# Patient Record
Sex: Male | Born: 1990 | Race: Black or African American | Hispanic: No | Marital: Single | Smoking: Current every day smoker
Health system: Southern US, Community
[De-identification: ages and names within clinical notes are randomized; demographics above are authoritative.]

## PROBLEM LIST (undated history)

## (undated) DIAGNOSIS — J45909 Unspecified asthma, uncomplicated: Secondary | ICD-10-CM

## (undated) DIAGNOSIS — E876 Hypokalemia: Secondary | ICD-10-CM

## (undated) DIAGNOSIS — I10 Essential (primary) hypertension: Secondary | ICD-10-CM

## (undated) HISTORY — DX: Morbid (severe) obesity due to excess calories: E66.01

## (undated) HISTORY — DX: Hypokalemia: E87.6

---
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
## 2017-05-18 ENCOUNTER — Encounter (HOSPITAL_COMMUNITY): Payer: Self-pay | Admitting: Emergency Medicine

## 2017-05-18 ENCOUNTER — Emergency Department (HOSPITAL_COMMUNITY)
Admission: EM | Admit: 2017-05-18 | Discharge: 2017-05-19 | Disposition: A | Discharge status: Home / Self Care | Payer: Self-pay | Attending: Emergency Medicine | Admitting: Emergency Medicine

## 2017-05-18 DIAGNOSIS — R002 Palpitations: Secondary | ICD-10-CM | POA: Insufficient documentation

## 2017-05-18 DIAGNOSIS — F17200 Nicotine dependence, unspecified, uncomplicated: Secondary | ICD-10-CM | POA: Insufficient documentation

## 2017-05-18 DIAGNOSIS — I10 Essential (primary) hypertension: Secondary | ICD-10-CM | POA: Insufficient documentation

## 2017-05-18 DIAGNOSIS — E876 Hypokalemia: Secondary | ICD-10-CM | POA: Insufficient documentation

## 2017-05-18 HISTORY — DX: Essential (primary) hypertension: I10

## 2017-05-18 NOTE — ED Triage Notes (Signed)
Pt reports intermittent dizziness x 2 weeks. Pt reports after returning home from church this evening he began having palpations that he has had in the past, pt he felt worse this time with "fluttering" pt reports he felt very anxious and wants to be checked. Pt states dizziness and palpations have resolved.

## 2017-05-18 NOTE — ED Provider Notes (Signed)
[facility]-EMERGENCY DEPT Provider Note   CSN: [id number] Arrival date & time: 05/18/17  2209  By signing my name below, I, Kenneth Willis, attest that this documentation has been prepared under the direction and in the presence of physician practitioner, Azalia Bilis, MD. Electronically Signed: Linna Willis, Scribe. 05/18/2017. 12:10 AM.  History   Chief Complaint Chief Complaint  Patient presents with  . Palpitations   The history is provided by the patient. No language interpreter was used.    HPI Comments: Kenneth Willis is a 26 y.o. male with PMHx of HTN who presents to the Emergency Department complaining of intermittent palpitations beginning yesterday evening. He states he was at church when his palpitations initially presented. Patient reports his palpitations resolved but presented again a couple of hours later while he was lying down at home. He reports some associated anxiety. He states he has experienced palpitations before but they have never been as significant as his current onset. He feels as though his palpitations are improving and his heart is beating normally. He denies excessive caffeine intake. No recent OTC medications. Patient denies chest pain, dyspnea, nausea, vomiting, lightheadedness, or any other associated symptoms.  Past Medical History:  Diagnosis Date  . Hypertension     There are no active problems to display for this patient.   History reviewed. No pertinent surgical history.     Home Medications    Prior to Admission medications   Not on File    Family History No family history on file.  Social History Social History  Substance Use Topics  . Smoking status: Current Every Day Smoker  . Smokeless tobacco: Never Used  . Alcohol use No     Allergies   Patient has no known allergies.   Review of Systems Review of Systems All other systems reviewed and are negative for acute change except as noted in the HPI. Physical Exam Updated  Vital Signs BP (!) 151/80 (BP Location: Right Arm)   Pulse 83   Temp 98.8 F (37.1 C) (Oral)   Resp 17   Ht 5\' 4"  (1.626 m)   SpO2 99%   Physical Exam  Constitutional: He is oriented to person, place, and time. He appears well-developed and well-nourished.  HENT:  Head: Normocephalic and atraumatic.  Eyes: EOM are normal.  Neck: Normal range of motion.  Cardiovascular: Normal rate, regular rhythm, normal heart sounds and intact distal pulses.   Pulmonary/Chest: Effort normal and breath sounds normal. No respiratory distress.  Abdominal: Soft. He exhibits no distension. There is no tenderness.  Musculoskeletal: Normal range of motion.  Neurological: He is alert and oriented to person, place, and time.  Skin: Skin is warm and dry.  Psychiatric: He has a normal mood and affect. Judgment normal.  Nursing note and vitals reviewed.  ED Treatments / Results  Labs (all labs ordered are listed, but only abnormal results are displayed) Labs Reviewed  I-STAT CHEM 8, ED - Abnormal; Notable for the following:       Result Value   Potassium 2.9 (*)    Chloride 99 (*)    Creatinine, Ser 1.40 (*)    Glucose, Bld 108 (*)    All other components within normal limits    EKG  EKG Interpretation  Date/Time:  Sunday May 18 2017 22:25:38 EDT Ventricular Rate:  77 PR Interval:  216 QRS Duration: 98 QT Interval:  384 QTC Calculation: 434 R Axis:   54 Text Interpretation:  Sinus rhythm with 1st degree A-V  block ST & T wave abnormality, consider inferior ischemia Abnormal ECG No old tracing to compare Confirmed by Azalia Bilisampos, Bilan Tedesco ([id number]) on 05/18/2017 11:15:24 PM       Radiology No results found.  Procedures Procedures (including critical care time)  DIAGNOSTIC STUDIES: Oxygen Saturation is 99% on RA, normal by my interpretation.    COORDINATION OF CARE: 12:08 AM Discussed treatment plan with pt at bedside and pt agreed to plan.  Medications Ordered in ED Medications  potassium  chloride SA (K-DUR,KLOR-CON) CR tablet 40 mEq (not administered)     Initial Impression / Assessment and Plan / ED Course  I have reviewed the triage vital signs and the nursing notes.  Pertinent labs & imaging results that were available during my care of the patient were reviewed by me and considered in my medical decision making (see chart for details).     Overall well-appearing.  Palpitations.  Found to have mild hypokalemia.  This will be replaced orally.  Discharged home with potassium supplementation.  Primary care follow-up.  Final Clinical Impressions(s) / ED Diagnoses   Final diagnoses:  None    New Prescriptions New Prescriptions   POTASSIUM CHLORIDE SA (K-DUR,KLOR-CON) 20 MEQ TABLET    Take 1 tablet (20 mEq total) by mouth daily.   I personally performed the services described in this documentation, which was scribed in my presence. The recorded information has been reviewed and is accurate.       Azalia Bilisampos, Raschelle Wisenbaker, MD 05/19/17 [phone]

## 2017-05-19 LAB — I-STAT CHEM 8, ED
BUN: 18 mg/dL (ref 6–20)
Calcium, Ion: 1.19 mmol/L (ref 1.15–1.40)
Chloride: 99 mmol/L — ABNORMAL LOW (ref 101–111)
Creatinine, Ser: 1.4 mg/dL — ABNORMAL HIGH (ref 0.61–1.24)
Glucose, Bld: 108 mg/dL — ABNORMAL HIGH (ref 65–99)
HCT: 45 % (ref 39.0–52.0)
HEMOGLOBIN: 15.3 g/dL (ref 13.0–17.0)
POTASSIUM: 2.9 mmol/L — AB (ref 3.5–5.1)
SODIUM: 139 mmol/L (ref 135–145)
TCO2: 28 mmol/L (ref 0–100)

## 2017-05-19 MED ORDER — POTASSIUM CHLORIDE CRYS ER 20 MEQ PO TBCR: 20.0000 meq | EXTENDED_RELEASE_TABLET | Freq: Every day | ORAL | 0 refills | Status: DC

## 2017-05-19 MED ORDER — POTASSIUM CHLORIDE CRYS ER 20 MEQ PO TBCR
40.0000 meq | EXTENDED_RELEASE_TABLET | Freq: Once | ORAL | Status: AC
  Administered 2017-05-19: 40 meq via ORAL
  Filled 2017-05-19: qty 2

## 2017-05-19 NOTE — ED Notes (Signed)
Pt understood dc material. NAD noted. Scripts given at dc 

## 2017-06-26 ENCOUNTER — Emergency Department (HOSPITAL_COMMUNITY)
Admission: EM | Admit: 2017-06-26 | Discharge: 2017-06-26 | Disposition: A | Discharge status: Home / Self Care | Payer: Self-pay | Attending: Emergency Medicine | Admitting: Emergency Medicine

## 2017-06-26 ENCOUNTER — Encounter (HOSPITAL_COMMUNITY): Payer: Self-pay

## 2017-06-26 ENCOUNTER — Emergency Department (HOSPITAL_COMMUNITY): Payer: Self-pay

## 2017-06-26 DIAGNOSIS — R079 Chest pain, unspecified: Secondary | ICD-10-CM | POA: Insufficient documentation

## 2017-06-26 DIAGNOSIS — F17210 Nicotine dependence, cigarettes, uncomplicated: Secondary | ICD-10-CM | POA: Insufficient documentation

## 2017-06-26 DIAGNOSIS — J45909 Unspecified asthma, uncomplicated: Secondary | ICD-10-CM | POA: Insufficient documentation

## 2017-06-26 DIAGNOSIS — Z79899 Other long term (current) drug therapy: Secondary | ICD-10-CM | POA: Insufficient documentation

## 2017-06-26 DIAGNOSIS — R002 Palpitations: Secondary | ICD-10-CM | POA: Insufficient documentation

## 2017-06-26 DIAGNOSIS — I10 Essential (primary) hypertension: Secondary | ICD-10-CM | POA: Insufficient documentation

## 2017-06-26 HISTORY — DX: Unspecified asthma, uncomplicated: J45.909

## 2017-06-26 LAB — BASIC METABOLIC PANEL
Anion gap: 9 (ref 5–15)
BUN: 11 mg/dL (ref 6–20)
CHLORIDE: 101 mmol/L (ref 101–111)
CO2: 28 mmol/L (ref 22–32)
CREATININE: 1.12 mg/dL (ref 0.61–1.24)
Calcium: 9.4 mg/dL (ref 8.9–10.3)
GFR calc non Af Amer: >60 mL/min (ref >60)
Glucose, Bld: 142 mg/dL — ABNORMAL HIGH (ref 65–99)
Potassium: 3.5 mmol/L (ref 3.5–5.1)
SODIUM: 138 mmol/L (ref 135–145)

## 2017-06-26 LAB — POCT I-STAT TROPONIN I: Troponin i, poc: 0 ng/mL (ref 0.00–0.08)

## 2017-06-26 LAB — CBC
HCT: 49.7 % (ref 39.0–52.0)
Hemoglobin: 17.9 g/dL — ABNORMAL HIGH (ref 13.0–17.0)
MCH: 31.4 pg (ref 26.0–34.0)
MCHC: 36 g/dL (ref 30.0–36.0)
MCV: 87.2 fL (ref 78.0–100.0)
PLATELETS: 176 10*3/uL (ref 150–400)
RBC: 5.7 MIL/uL (ref 4.22–5.81)
RDW: 13.4 % (ref 11.5–15.5)
WBC: 5 10*3/uL (ref 4.0–10.5)

## 2017-06-26 NOTE — ED Notes (Signed)
Bed: WA10 Expected date:  Expected time:  Means of arrival:  Comments: 

## 2017-06-26 NOTE — Discharge Instructions (Signed)
All the results in the ER are normal, labs and imaging. We are not sure what is causing your symptoms. The workup in the ER is not complete, and is limited to screening for life threatening and emergent conditions only, so please see a primary care doctor for further evaluation.  

## 2017-06-26 NOTE — ED Provider Notes (Signed)
[facility]-EMERGENCY DEPT Provider Note   CSN: [id number] Arrival date & time: 06/26/17  0047  By signing my name below, I, Cynda AcresHailei Fulton, attest that this documentation has been prepared under the direction and in the presence of Derwood KaplanNanavati, Aspen Lawrance, MD. Electronically Signed: Cynda AcresHailei Fulton, Scribe. 06/26/17. 3:37 AM.  History   Chief Complaint Chief Complaint  Patient presents with  . Palpitations    HPI Comments: Kenneth Willis is a 26 y.o. male with a history of asthma and hypertension, who presents to the Emergency Department complaining of persistent palpitations that began this morning. Patient reports intermittent chest palpitations with 4/10 chest pian. Patient states his episodes last a few seconds. Patient states his symptoms have improved since the day has progressed, he has no chest pain at present. Patient states every time he goes to the hospital he is usually told that he has low potassium. No additional symptoms noted. Patient reports eating a banana with some relief. Patient denies any drug use, but does report daily tobacco use of half a pack a day. Patient denies any shortness of breath, cough, leg swelling, diaphoresis, fever, chills, nausea, vomiting, diarrhea, or any additional symptoms.   The history is provided by the patient. No language interpreter was used.    Past Medical History:  Diagnosis Date  . Asthma   . Hypertension     There are no active problems to display for this patient.   History reviewed. No pertinent surgical history.     Home Medications    Prior to Admission medications   Medication Sig Start Date End Date Taking? Authorizing Provider  albuterol (PROVENTIL HFA;VENTOLIN HFA) 108 (90 Base) MCG/ACT inhaler Inhale 1-2 puffs into the lungs every 6 (six) hours as needed for wheezing or shortness of breath.   Yes [provider]  hydrochlorothiazide (HYDRODIURIL) 25 MG tablet Take 50 mg by mouth daily.   Yes [provider]    metoprolol tartrate (LOPRESSOR) 50 MG tablet Take 50 mg by mouth 2 (two) times daily.   Yes [provider]  potassium chloride SA (K-DUR,KLOR-CON) 20 MEQ tablet Take 1 tablet (20 mEq total) by mouth daily. Patient not taking: Reported on 06/26/2017 05/19/17   Azalia Bilisampos, Kevin, MD    Family History History reviewed. No pertinent family history.  Social History Social History  Substance Use Topics  . Smoking status: Current Every Day Smoker  . Smokeless tobacco: Never Used  . Alcohol use No     Allergies   Patient has no known allergies.   Review of Systems Review of Systems  Constitutional: Negative for chills and fever.  Respiratory: Negative for cough and shortness of breath.   Cardiovascular: Positive for chest pain and palpitations. Negative for leg swelling.  Gastrointestinal: Negative for diarrhea, nausea and vomiting.  All other systems reviewed and are negative.    Physical Exam Updated Vital Signs BP 123/82 (BP Location: Left Arm)   Pulse 74   Temp 98.5 F (36.9 C) (Oral)   Resp 18   SpO2 99%   Physical Exam  Constitutional: He is oriented to person, place, and time. He appears well-developed.  HENT:  Head: Normocephalic and atraumatic.  Mouth/Throat: Oropharynx is clear and moist.  Eyes: Pupils are equal, round, and reactive to light. Conjunctivae and EOM are normal.  Neck: Normal range of motion. Neck supple.  Cardiovascular: Normal rate and regular rhythm.   1+ and equal radial pulse bilaterally.   Pulmonary/Chest: Effort normal and breath sounds normal.  No reproducible chest  wall tenderness.   Abdominal: Soft. Bowel sounds are normal.  Positive bowel sounds.   Neurological: He is alert and oriented to person, place, and time.  Skin: Skin is warm and dry.  Nursing note and vitals reviewed.    ED Treatments / Results  DIAGNOSTIC STUDIES: Oxygen Saturation is 99% on RA, normal by my interpretation.    COORDINATION OF CARE: 3:37 AM  Discussed treatment plan with pt at bedside and pt agreed to plan.  Labs (all labs ordered are listed, but only abnormal results are displayed) Labs Reviewed  BASIC METABOLIC PANEL - Abnormal; Notable for the following:       Result Value   Glucose, Bld 142 (*)    All other components within normal limits  CBC - Abnormal; Notable for the following:    Hemoglobin 17.9 (*)    All other components within normal limits  I-STAT TROPOININ, ED  POCT I-STAT TROPONIN I    EKG  EKG Interpretation  Date/Time:  Thursday June 26 2017 01:05:34 EDT Ventricular Rate:  76 PR Interval:    QRS Duration: 92 QT Interval:  365 QTC Calculation: 411 R Axis:   62 Text Interpretation:  Sinus rhythm Borderline prolonged PR interval Borderline repolarization abnormality No acute changes No significant change since last tracing Confirmed by Derwood Kaplan ([id number]) on 06/26/2017 2:44:25 AM       Radiology Dg Chest 2 View  Result Date: 06/26/2017 CLINICAL DATA:  26 year old male with sudden onset chest pain. EXAM: CHEST  2 VIEW COMPARISON:  None. FINDINGS: The heart size and mediastinal contours are within normal limits. Both lungs are clear. The visualized skeletal structures are unremarkable. IMPRESSION: No active cardiopulmonary disease. Electronically Signed   By: Elgie Collard M.D.   On: 06/26/2017 03:06    Procedures Procedures (including critical care time)  Medications Ordered in ED Medications - No data to display   Initial Impression / Assessment and Plan / ED Course  I have reviewed the triage vital signs and the nursing notes.  Pertinent labs & imaging results that were available during my care of the patient were reviewed by me and considered in my medical decision making (see chart for details).     Pt comes in with cc of palpitations. Pt has intermittent palpitations, no heart racing. There is no associated n/v/f/c. Pt has no associated chest pain, dib.  Labs normal, ekg is  reassuring. No alarms for tachycardia here. Stable for d/c.   Final Clinical Impressions(s) / ED Diagnoses   Final diagnoses:  Palpitations    New Prescriptions New Prescriptions   No medications on file   I personally performed the services described in this documentation, which was scribed in my presence. The recorded information has been reviewed and is accurate.     Derwood Kaplan, MD 06/26/17 [id number]

## 2017-06-26 NOTE — ED Triage Notes (Signed)
Pt reports intermittent left sided palpations/cp 4/10 that started this 7/11 in the AM. Pt A+OX4, speaking in complete sentences, ambulatory independently to triage.

## 2017-06-28 ENCOUNTER — Emergency Department (HOSPITAL_COMMUNITY): Payer: Self-pay

## 2017-06-28 ENCOUNTER — Emergency Department (HOSPITAL_COMMUNITY)
Admission: EM | Admit: 2017-06-28 | Discharge: 2017-06-28 | Disposition: A | Discharge status: Home / Self Care | Payer: Self-pay | Attending: Emergency Medicine | Admitting: Emergency Medicine

## 2017-06-28 ENCOUNTER — Encounter (HOSPITAL_COMMUNITY): Payer: Self-pay | Admitting: Emergency Medicine

## 2017-06-28 ENCOUNTER — Other Ambulatory Visit: Payer: Self-pay

## 2017-06-28 DIAGNOSIS — Z5321 Procedure and treatment not carried out due to patient leaving prior to being seen by health care provider: Secondary | ICD-10-CM | POA: Insufficient documentation

## 2017-06-28 LAB — CBC
HCT: 50.6 % (ref 39.0–52.0)
Hemoglobin: 17.8 g/dL — ABNORMAL HIGH (ref 13.0–17.0)
MCH: 31.1 pg (ref 26.0–34.0)
MCHC: 35.2 g/dL (ref 30.0–36.0)
MCV: 88.3 fL (ref 78.0–100.0)
PLATELETS: 188 10*3/uL (ref 150–400)
RBC: 5.73 MIL/uL (ref 4.22–5.81)
RDW: 13.3 % (ref 11.5–15.5)
WBC: 4.4 10*3/uL (ref 4.0–10.5)

## 2017-06-28 LAB — BASIC METABOLIC PANEL
Anion gap: 10 (ref 5–15)
BUN: 9 mg/dL (ref 6–20)
CO2: 27 mmol/L (ref 22–32)
CREATININE: 1.18 mg/dL (ref 0.61–1.24)
Calcium: 9.4 mg/dL (ref 8.9–10.3)
Chloride: 101 mmol/L (ref 101–111)
GFR calc Af Amer: >60 mL/min (ref >60)
Glucose, Bld: 99 mg/dL (ref 65–99)
Potassium: 3.4 mmol/L — ABNORMAL LOW (ref 3.5–5.1)
SODIUM: 138 mmol/L (ref 135–145)

## 2017-06-28 LAB — I-STAT TROPONIN, ED: Troponin i, poc: 0.01 ng/mL (ref 0.00–0.08)

## 2017-06-28 NOTE — ED Notes (Signed)
Pt called to bring to room pt did not answer x3.

## 2017-06-28 NOTE — ED Triage Notes (Signed)
C/o intermittent palpitations and L sided chest pain all day on Friday. Denies chest pain at present but states L arm is hurting.  Denies sob, nausea, and vomiting.

## 2017-06-28 NOTE — ED Notes (Signed)
No answer x3

## 2017-08-06 ENCOUNTER — Emergency Department (HOSPITAL_COMMUNITY): Payer: Self-pay

## 2017-08-06 ENCOUNTER — Emergency Department (HOSPITAL_COMMUNITY)
Admission: EM | Admit: 2017-08-06 | Discharge: 2017-08-06 | Disposition: A | Discharge status: Home / Self Care | Payer: Self-pay | Attending: Emergency Medicine | Admitting: Emergency Medicine

## 2017-08-06 ENCOUNTER — Encounter (HOSPITAL_COMMUNITY): Payer: Self-pay

## 2017-08-06 DIAGNOSIS — R002 Palpitations: Secondary | ICD-10-CM | POA: Insufficient documentation

## 2017-08-06 DIAGNOSIS — E876 Hypokalemia: Secondary | ICD-10-CM | POA: Insufficient documentation

## 2017-08-06 DIAGNOSIS — Z79899 Other long term (current) drug therapy: Secondary | ICD-10-CM | POA: Insufficient documentation

## 2017-08-06 DIAGNOSIS — I10 Essential (primary) hypertension: Secondary | ICD-10-CM | POA: Insufficient documentation

## 2017-08-06 DIAGNOSIS — J45909 Unspecified asthma, uncomplicated: Secondary | ICD-10-CM | POA: Insufficient documentation

## 2017-08-06 DIAGNOSIS — R7989 Other specified abnormal findings of blood chemistry: Secondary | ICD-10-CM

## 2017-08-06 DIAGNOSIS — F17210 Nicotine dependence, cigarettes, uncomplicated: Secondary | ICD-10-CM | POA: Insufficient documentation

## 2017-08-06 LAB — BASIC METABOLIC PANEL
ANION GAP: 6 (ref 5–15)
BUN: 14 mg/dL (ref 6–20)
CALCIUM: 9.4 mg/dL (ref 8.9–10.3)
CHLORIDE: 103 mmol/L (ref 101–111)
CO2: 29 mmol/L (ref 22–32)
CREATININE: 1.27 mg/dL — AB (ref 0.61–1.24)
GLUCOSE: 101 mg/dL — AB (ref 65–99)
Potassium: 3 mmol/L — ABNORMAL LOW (ref 3.5–5.1)
SODIUM: 138 mmol/L (ref 135–145)

## 2017-08-06 LAB — CBC
HCT: 46.9 % (ref 39.0–52.0)
Hemoglobin: 16.8 g/dL (ref 13.0–17.0)
MCH: 31.3 pg (ref 26.0–34.0)
MCHC: 35.8 g/dL (ref 30.0–36.0)
MCV: 87.3 fL (ref 78.0–100.0)
Platelets: 164 10*3/uL (ref 150–400)
RBC: 5.37 MIL/uL (ref 4.22–5.81)
RDW: 13.5 % (ref 11.5–15.5)
WBC: 4.5 10*3/uL (ref 4.0–10.5)

## 2017-08-06 LAB — POCT I-STAT TROPONIN I: TROPONIN I, POC: 0.01 ng/mL (ref 0.00–0.08)

## 2017-08-06 MED ORDER — POTASSIUM CHLORIDE CRYS ER 20 MEQ PO TBCR
40.0000 meq | EXTENDED_RELEASE_TABLET | Freq: Once | ORAL | Status: AC
  Administered 2017-08-06: 40 meq via ORAL
  Filled 2017-08-06: qty 2

## 2017-08-06 MED ORDER — POTASSIUM CHLORIDE CRYS ER 20 MEQ PO TBCR: 20.0000 meq | EXTENDED_RELEASE_TABLET | Freq: Every day | ORAL | 0 refills | Status: DC

## 2017-08-06 NOTE — ED Provider Notes (Signed)
[facility]-EMERGENCY DEPT Provider Note   CSN: [id number] Arrival date & time: 08/06/17  1825     History   Chief Complaint Chief Complaint  Patient presents with  . Palpitations    HPI Kenneth Willis is a 26 y.o. male.  Patient presents to the ED with a chief complaint of heart palpitations. He states that he has had palpitations in the past before. He states that when he has had them before, it has been because of low potassium.  States that he has been seen here for the same.  He denies any lightheadedness, dizziness, CP, SOB, or abdominal pain.  He denies any nausea, vomiting, or diarrhea.  Denies any stimulant use.  He doesn't have a PCP.  He is fairly new to the area.   The history is provided by the patient. No language interpreter was used.    Past Medical History:  Diagnosis Date  . Asthma   . Hypertension     There are no active problems to display for this patient.   History reviewed. No pertinent surgical history.     Home Medications    Prior to Admission medications   Medication Sig Start Date End Date Taking? Authorizing Provider  albuterol (PROVENTIL HFA;VENTOLIN HFA) 108 (90 Base) MCG/ACT inhaler Inhale 1-2 puffs into the lungs every 6 (six) hours as needed for wheezing or shortness of breath.    [provider]  hydrochlorothiazide (HYDRODIURIL) 25 MG tablet Take 50 mg by mouth daily.    [provider]  metoprolol tartrate (LOPRESSOR) 50 MG tablet Take 50 mg by mouth 2 (two) times daily.    [provider]  potassium chloride SA (K-DUR,KLOR-CON) 20 MEQ tablet Take 1 tablet (20 mEq total) by mouth daily. Patient not taking: Reported on 06/26/2017 05/19/17   Azalia Bilis, MD    Family History No family history on file.  Social History Social History  Substance Use Topics  . Smoking status: Current Every Day Smoker    Packs/day: 0.35    Types: Cigarettes  . Smokeless tobacco: Never Used  . Alcohol use No     Allergies     Fish allergy and Shellfish allergy   Review of Systems Review of Systems  All other systems reviewed and are negative.    Physical Exam Updated Vital Signs BP (!) 149/89 (BP Location: Right Arm)   Pulse 79   Temp 98.2 F (36.8 C) (Oral)   Resp 14   Ht 5\' 6"  (1.676 m)   Wt (!) 154.2 kg (340 lb)   SpO2 99%   BMI 54.88 kg/m   Physical Exam  Constitutional: He is oriented to person, place, and time. He appears well-developed and well-nourished.  HENT:  Head: Normocephalic and atraumatic.  Eyes: Pupils are equal, round, and reactive to light. Conjunctivae and EOM are normal. Right eye exhibits no discharge. Left eye exhibits no discharge. No scleral icterus.  Neck: Normal range of motion. Neck supple. No JVD present.  Cardiovascular: Normal rate, regular rhythm and normal heart sounds.  Exam reveals no gallop and no friction rub.   No murmur heard. Pulmonary/Chest: Effort normal and breath sounds normal. No respiratory distress. He has no wheezes. He has no rales. He exhibits no tenderness.  Abdominal: Soft. He exhibits no distension and no mass. There is no tenderness. There is no rebound and no guarding.  Musculoskeletal: Normal range of motion. He exhibits no edema or tenderness.  Neurological: He is alert and oriented to person, place, and  time.  Skin: Skin is warm and dry.  Psychiatric: He has a normal mood and affect. His behavior is normal. Judgment and thought content normal.  Nursing note and vitals reviewed.    ED Treatments / Results  Labs (all labs ordered are listed, but only abnormal results are displayed) Labs Reviewed  BASIC METABOLIC PANEL - Abnormal; Notable for the following:       Result Value   Potassium 3.0 (*)    Glucose, Bld 101 (*)    Creatinine, Ser 1.27 (*)    All other components within normal limits  CBC  I-STAT TROPONIN, ED  POCT I-STAT TROPONIN I    EKG  EKG Interpretation None       Radiology Dg Chest 2 View  Result Date:  08/06/2017 CLINICAL DATA:  Chest palpitations at work yesterday through this morning. EXAM: CHEST  2 VIEW COMPARISON:  06/28/2017 CXR FINDINGS: The heart size and mediastinal contours are within normal limits. Both lungs are clear. The visualized skeletal structures are unremarkable. IMPRESSION: No active cardiopulmonary disease. Electronically Signed   By: Tollie Eth M.D.   On: 08/06/2017 20:04    Procedures Procedures (including critical care time)  Medications Ordered in ED Medications - No data to display   Initial Impression / Assessment and Plan / ED Course  I have reviewed the triage vital signs and the nursing notes.  Pertinent labs & imaging results that were available during my care of the patient were reviewed by me and considered in my medical decision making (see chart for details).  Clinical Course as of Aug 06 2256  Wed Aug 06, 2017  2042 EKG 12-Lead [CK]  2054 DG Chest 2 View [CK]  2056 EKG 12-Lead [CK]    Clinical Course User Index [CK] Jacqlyn Krauss, [place]    Patient with palpitations.  Hx of the same.  Mild hypokalemia; thought to be 2/2 HCTZ.  Will replete potassium in ED and discharge home with potassium supplement for the next few days.  Recommend PCP follow-up, will give resources to establish care.    Patient agrees with plan.  He has no complaints now.  Cr also mildly elevated, encouraged to drink more water.    Final Clinical Impressions(s) / ED Diagnoses   Final diagnoses:  Palpitations  Hypokalemia  Elevated serum creatinine    New Prescriptions Current Discharge Medication List       Felipa Furnace 08/06/17 2306    Zadie Rhine, MD 08/06/17 2317

## 2017-08-06 NOTE — Discharge Instructions (Signed)
Please follow-up with the group listed below.  Make an appointment ASAP.

## 2017-08-06 NOTE — ED Triage Notes (Signed)
Patient c/o having palpitations at work yesterday most of the day and through this morning. Patient is not currently having palpitations. Patient denies any SOB or chest pain at this time.

## 2017-10-24 ENCOUNTER — Encounter (HOSPITAL_COMMUNITY): Payer: Self-pay | Admitting: Emergency Medicine

## 2017-10-24 ENCOUNTER — Emergency Department (HOSPITAL_COMMUNITY)
Admission: EM | Admit: 2017-10-24 | Discharge: 2017-10-25 | Disposition: A | Discharge status: Home / Self Care | Payer: Self-pay | Attending: Emergency Medicine | Admitting: Emergency Medicine

## 2017-10-24 ENCOUNTER — Other Ambulatory Visit: Payer: Self-pay

## 2017-10-24 DIAGNOSIS — I10 Essential (primary) hypertension: Secondary | ICD-10-CM | POA: Insufficient documentation

## 2017-10-24 DIAGNOSIS — Z760 Encounter for issue of repeat prescription: Secondary | ICD-10-CM | POA: Insufficient documentation

## 2017-10-24 DIAGNOSIS — Z91013 Allergy to seafood: Secondary | ICD-10-CM | POA: Insufficient documentation

## 2017-10-24 DIAGNOSIS — R002 Palpitations: Secondary | ICD-10-CM | POA: Insufficient documentation

## 2017-10-24 DIAGNOSIS — F17210 Nicotine dependence, cigarettes, uncomplicated: Secondary | ICD-10-CM | POA: Insufficient documentation

## 2017-10-24 DIAGNOSIS — E876 Hypokalemia: Secondary | ICD-10-CM | POA: Insufficient documentation

## 2017-10-24 DIAGNOSIS — J45909 Unspecified asthma, uncomplicated: Secondary | ICD-10-CM | POA: Insufficient documentation

## 2017-10-24 LAB — I-STAT CHEM 8, ED
BUN: 10 mg/dL (ref 6–20)
Calcium, Ion: 1.16 mmol/L (ref 1.15–1.40)
Chloride: 99 mmol/L — ABNORMAL LOW (ref 101–111)
Creatinine, Ser: 1.3 mg/dL — ABNORMAL HIGH (ref 0.61–1.24)
GLUCOSE: 95 mg/dL (ref 65–99)
HEMATOCRIT: 56 % — AB (ref 39.0–52.0)
HEMOGLOBIN: 19 g/dL — AB (ref 13.0–17.0)
POTASSIUM: 3.1 mmol/L — AB (ref 3.5–5.1)
Sodium: 139 mmol/L (ref 135–145)
TCO2: 26 mmol/L (ref 22–32)

## 2017-10-24 MED ORDER — POTASSIUM CHLORIDE CRYS ER 20 MEQ PO TBCR
40.0000 meq | EXTENDED_RELEASE_TABLET | Freq: Once | ORAL | Status: AC
  Administered 2017-10-24: 40 meq via ORAL
  Filled 2017-10-24: qty 2

## 2017-10-24 MED ORDER — ALBUTEROL SULFATE HFA 108 (90 BASE) MCG/ACT IN AERS
1.0000 | INHALATION_SPRAY | Freq: Once | RESPIRATORY_TRACT | Status: AC
  Administered 2017-10-24: 1 via RESPIRATORY_TRACT
  Filled 2017-10-24: qty 6.7

## 2017-10-24 MED ORDER — POTASSIUM CHLORIDE CRYS ER 20 MEQ PO TBCR: 20.0000 meq | EXTENDED_RELEASE_TABLET | Freq: Every day | ORAL | 0 refills | Status: DC

## 2017-10-24 NOTE — ED Notes (Signed)
Notified EDP,Mcmaness,MD., pt. I-stat Chem 8 results potassium 3.1 and RN,Lisa made aware.

## 2017-10-24 NOTE — ED Triage Notes (Signed)
Pt states he has been having heart palpitations today  Pt states he takes HCTZ and Metoprolol and ususally when he starts getting these palpitations it is due to low potassium   Pt states he has been having some left arm weakness and some sharp pains in his left upper chest  Denies pain at this time

## 2017-10-24 NOTE — ED Provider Notes (Signed)
[facility]-EMERGENCY DEPT Provider Note   CSN: [id number] Arrival date & time: 10/24/17  2022     History   Chief Complaint Chief Complaint  Patient presents with  . Palpitations    HPI Kenneth Willis is a 26 y.o. male with history of asthma and hypertension who presents today with chief complaint acute onset, resolved episode of palpitations which occurred earlier today.  Patient states that this morning he experienced an episode of palpitations which lasted a few seconds before resolving and has not experienced the symptoms since.  States that in the past he has had palpitations related to hypokalemia.  He did take his HCTZ today.  He saw a cardiologist/vascular specialist last year who had been prescribing his medications, but he has been receiving refills of his metoprolol, HCTZ, and albuterol from the ED most recently.  He does not have a primary care physician or cardiologist here.  He is still smoking approximately half a pack of cigarettes daily.  Denies excessive caffeine intake, drug use.  He denies lightheadedness, chest pain, shortness of breath, leg swelling, abdominal pain, or vomiting.  He had nausea earlier today but this resolved.  No aggravating or alleviating factors noted.  He did not try anything for his symptoms.  He denies recent travel or surgeries, hemoptysis, testosterone hormone replacement, prior history of DVT or PE, IV drug use, or history of cancer.  He is requesting albuterol refill.  The history is provided by the patient.    Past Medical History:  Diagnosis Date  . Asthma   . Hypertension     There are no active problems to display for this patient.   History reviewed. No pertinent surgical history.     Home Medications    Prior to Admission medications   Medication Sig Start Date End Date Taking? Authorizing Provider  albuterol (PROVENTIL HFA;VENTOLIN HFA) 108 (90 Base) MCG/ACT inhaler Inhale 1-2 puffs into the lungs  every 6 (six) hours as needed for wheezing or shortness of breath.    [provider]  hydrochlorothiazide (HYDRODIURIL) 25 MG tablet Take 50 mg by mouth daily.    [provider]  metoprolol tartrate (LOPRESSOR) 50 MG tablet Take 50 mg by mouth 2 (two) times daily.    [provider]  potassium chloride SA (K-DUR,KLOR-CON) 20 MEQ tablet Take 1 tablet (20 mEq total) daily for 3 days by mouth. 10/24/17 10/27/17  Jeanie SewerFawze, Kamilah Correia A, PA-C    Family History Family History  Problem Relation Age of Onset  . Diabetes Other   . Hypertension Other     Social History Social History   Tobacco Use  . Smoking status: Current Every Day Smoker    Packs/day: 0.35    Types: Cigarettes  . Smokeless tobacco: Never Used  Substance Use Topics  . Alcohol use: No  . Drug use: No     Allergies   Fish allergy and Shellfish allergy   Review of Systems Review of Systems  Constitutional: Positive for fatigue. Negative for chills and fever.  Eyes: Negative for visual disturbance.  Respiratory: Negative for cough and shortness of breath.   Cardiovascular: Positive for palpitations. Negative for chest pain and leg swelling.  Gastrointestinal: Positive for nausea (resolved). Negative for abdominal pain and vomiting.  Neurological: Negative for weakness, numbness and headaches.     Physical Exam Updated Vital Signs BP 137/77   Pulse 64   Temp 98.8 F (37.1 C) (Oral)   Resp 18   SpO2 100%  Physical Exam  Constitutional: He is oriented to person, place, and time. He appears well-developed and well-nourished. No distress.  HENT:  Head: Normocephalic and atraumatic.  Eyes: Conjunctivae are normal. Right eye exhibits no discharge. Left eye exhibits no discharge.  Neck: Normal range of motion. Neck supple. No JVD present. No tracheal deviation present. No thyromegaly present.  Cardiovascular: Normal rate, regular rhythm, normal heart sounds and intact distal pulses.  2+  radial and DP/PT pulses bl, negative Homan's bl   Pulmonary/Chest: Effort normal and breath sounds normal. No stridor. No respiratory distress. He has no wheezes. He has no rales. He exhibits no tenderness.  Abdominal: Soft. Bowel sounds are normal. He exhibits no distension. There is no tenderness.  Musculoskeletal: Normal range of motion. He exhibits no edema or tenderness.  Neurological: He is alert and oriented to person, place, and time. No sensory deficit. He exhibits normal muscle tone.  Skin: Skin is warm and dry. No erythema.  Psychiatric: He has a normal mood and affect. His behavior is normal.  Nursing note and vitals reviewed.    ED Treatments / Results  Labs (all labs ordered are listed, but only abnormal results are displayed) Labs Reviewed  I-STAT CHEM 8, ED - Abnormal; Notable for the following components:      Result Value   Potassium 3.1 (*)    Chloride 99 (*)    Creatinine, Ser 1.30 (*)    Hemoglobin 19.0 (*)    HCT 56.0 (*)    All other components within normal limits    EKG  EKG Interpretation  Date/Time:  Friday October 24 2017 20:37:19 EST Ventricular Rate:  73 PR Interval:    QRS Duration: 93 QT Interval:  354 QTC Calculation: 390 R Axis:   61 Text Interpretation:  Sinus rhythm Normal ECG T-wave changes no longer present Confirmed by Paula Libra ([id number]) on 10/24/2017 10:50:33 PM       Radiology No results found.  Procedures Procedures (including critical care time)  Medications Ordered in ED Medications  potassium chloride SA (K-DUR,KLOR-CON) CR tablet 40 mEq (40 mEq Oral Given 10/24/17 2339)  albuterol (PROVENTIL HFA;VENTOLIN HFA) 108 (90 Base) MCG/ACT inhaler 1 puff (1 puff Inhalation Given 10/24/17 2341)     Initial Impression / Assessment and Plan / ED Course  I have reviewed the triage vital signs and the nursing notes.  Pertinent labs & imaging results that were available during my care of the patient were reviewed by me and  considered in my medical decision making (see chart for details).     Patient with one brief episode of palpitations earlier today, resolved and has not returned.  Afebrile, vital signs are stable.  States this is typically due to hypokalemia while taking HCTZ.  No focal neurological deficits on examination.  He is PERC negative and I have low suspicion of PE.  EKG shows normal sinus rhythm, no evidence of arrhythmia or ST segment abnormality.  Low suspicion of ACS or MI in the absence of chest pain.   he is mildly hypokalemic, will replenish here in the ED today and sent home with potassium supplement to take home for the next few days.  Low suspicion of thyroid abnormality, although this is a possibility.  Emphasized the importance of follow-up with a primary care physician or cardiologist for reevaluation of his symptoms, possible Holter monitoring, and medication management.  Discussed indications for return to the ED.  Will refill his albuterol inhaler here today. Pt verbalized understanding of  and agreement with plan and is safe for discharge home at this time.   Final Clinical Impressions(s) / ED Diagnoses   Final diagnoses:  Palpitations  Hypokalemia    ED Discharge Orders        Ordered    potassium chloride SA (K-DUR,KLOR-CON) 20 MEQ tablet  Daily     10/24/17 2354       Jeanie SewerFawze, Martese Vanatta A, PA-C 10/25/17 0055    Rolland PorterJames, Mark, MD 11/02/17 2221

## 2017-10-24 NOTE — Discharge Instructions (Signed)
Take potassium once daily for 3 days as prescribed beginning tomorrow.  He received some potassium in the emergency department today.  Continue taking your medications as prescribed.  Follow-up with a primary care physician or cardiologist for reevaluation of your symptoms.  They may want to test your thyroid or do a Holter monitor.  Return to the ED for any concerning signs or symptoms develop.

## 2017-10-29 ENCOUNTER — Ambulatory Visit (INDEPENDENT_AMBULATORY_CARE_PROVIDER_SITE_OTHER): Payer: Self-pay | Admitting: Cardiology

## 2017-10-29 ENCOUNTER — Encounter: Payer: Self-pay | Admitting: Cardiology

## 2017-10-29 VITALS — BP 142/101 | HR 75 | Ht 66.0 in | Wt 305.0 lb

## 2017-10-29 DIAGNOSIS — Z13220 Encounter for screening for lipoid disorders: Secondary | ICD-10-CM

## 2017-10-29 DIAGNOSIS — Z716 Tobacco abuse counseling: Secondary | ICD-10-CM

## 2017-10-29 DIAGNOSIS — E876 Hypokalemia: Secondary | ICD-10-CM

## 2017-10-29 DIAGNOSIS — I10 Essential (primary) hypertension: Secondary | ICD-10-CM

## 2017-10-29 MED ORDER — LOSARTAN POTASSIUM 25 MG PO TABS: 25.0000 mg | ORAL_TABLET | Freq: Every day | ORAL | 6 refills | Status: DC

## 2017-10-29 NOTE — Progress Notes (Signed)
PCP: Patient, [name]  Clinic Note: Chief Complaint  Patient presents with  . New Patient (Initial Visit)    Hypertension, palpitations, hypokalemia  . Hospitalization Follow-up    HPI: Kenneth Willis is a 26 y.o. male with a PMH below who presents today for emergency room follow-up for palpitations, hypokalemia and hypertension. He has had hypertension diagnosed back when he was 26 years old. He has been on HCTZ, but has recently been having issues with hypokalemia.  He really only notes palpitations and chest discomfort when his potassium levels are low.  Kenneth Willis has been seen on several occasions in the emergency room over the last couple months for palpitations and chest discomfort.  Numerous emergency room visits dating back from February 2018 all the way to most recently October 12.  He has been evaluated in several different hospitals including [facility], [facility], [facility] as well as [facility]. He was actually seen by [facility] Gilmore [facility]Akhtar at [facility]ology in [place].  He had an echocardiogram done.  He reported to me that he had a stress echo done, but I cannot find the results.  -->  [place] and is therefore following up here.  Recent Hospitalizations:   October 05, 2017 [facility]) --noted 1 day history of intermittent lancing chest discomfort migratory throughout the chest.  Also noted couple weeks of palpitations usually associated with hypokalemia. -->  Potassium 2.9 (was 2.7 on October 7)  October 27, 2017 [facility]) -again palpitations happening when he tried to sleep.  Also feeling in the anterior portion of his chest.  Again noted to be hypokalemic (2.8).  Given potassium.  Was yet again referred to cardiology follow-up.  Studies Personally Reviewed - (if available, images/films reviewed: From Epic Chart or Care Everywhere)  Echo Jan 2018 - [facility] Cards Kenneth Willis(Kenneth Willis): Normal LV size and function.  EF 60-65%.  Mild to moderate  LVH.  GR 1 DD.  Trace TR.  Normal valves otherwise  Interval History: Kenneth Willis actually is a very pleasant young gentleman who is morbidly obese with a long-standing history of hypertension who is here now to establish a new cardiologist having moved from [place] area.  He has a distant family history of some cardiac disease with a maternal grandmother having heart failure maternal grandfather having an MI at the age of 26. He really has not had any further symptoms since his last time in the ER.  His potassium was repleted and he is now on standing Potassium supplementation.  He only notes that when his blood pressure is a little bit he gets a little bit of a headache and dizzy, but is feeling okay today.  He denies any more palpitations.  He indicates that he really only has the palpitations and his potassium is low.  He has not had any further chest pain or tightness.  He usually notes the tightness in his chest when it is associated with his asthma. He denies any PND, orthopnea or edema.  No syncope/near syncope or TIA/amaurosis fugax.  No claudication.  ROS: A comprehensive was performed. Review of Systems  Constitutional: Negative for malaise/fatigue and weight loss (Had been trying to lose weight, but not doing so well).  HENT: Negative for congestion, sinus pain and sore throat.   Respiratory: Positive for shortness of breath and wheezing.        With asthma  Gastrointestinal: Negative for abdominal pain, heartburn and nausea.  Genitourinary: Negative  for dysuria and frequency.  Musculoskeletal: Negative for back pain, falls and joint pain.       He does have cramps in his potassium level goes low  Skin: Negative.   Neurological: Positive for headaches (If his blood pressure gets high).  Endo/Heme/Allergies: Negative for environmental allergies.  Psychiatric/Behavioral: Negative for memory loss. The patient is not nervous/anxious and does not have insomnia.   All other  systems reviewed and are negative.  I have reviewed and (if needed) personally updated the patient's problem list, medications, allergies, past medical and surgical history, social and family history.   Past Medical History:  Diagnosis Date  . Asthma   . Hypertension 2011  . Hypokalemia due to loss of potassium    -While on HCTZ  . Morbid obesity (HCC)     History reviewed. No pertinent surgical history.  Current Meds  Medication Sig  . aspirin EC 81 MG tablet Take 81 mg daily as needed by mouth.  . metoprolol tartrate (LOPRESSOR) 50 MG tablet Take 50 mg by mouth 2 (two) times daily.  . [DISCONTINUED] hydrochlorothiazide (HYDRODIURIL) 25 MG tablet Take 50 mg by mouth daily.    Allergies  Allergen Reactions  . Fish Allergy Swelling  . Shrimp [Shellfish Allergy] Swelling    Social History   Socioeconomic History  . Marital status: Single    Spouse name: None  . Number of children: None  . Years of education: None  . Highest education level: None  Social Needs  . Financial resource strain: None  . Food insecurity - worry: None  . Food insecurity - inability: None  . Transportation needs - medical: None  . Transportation needs - non-medical: None  Occupational History  . None  Tobacco Use  . Smoking status: Current Every Day Smoker    Packs/day: 0.35    Types: Cigarettes  . Smokeless tobacco: Never Used  Substance and Sexual Activity  . Alcohol use: No  . Drug use: No  . Sexual activity: Not Currently  Other Topics Concern  . None  Social History Narrative   Recently moved from [place] to [place]    family history includes Arthritis in his mother; Asthma in his brother and sister; Diabetes in his other and sister; Heart attack (age of onset: 8) in his maternal grandfather; Heart failure in his maternal grandmother; Hypertension in his father, mother, other, and sister.    Wt Readings from Last 3 Encounters:  10/29/17 (!) 305 lb (138.3 kg)  08/06/17 (!)  340 lb (154.2 kg)    PHYSICAL EXAM BP (!) 142/101   Pulse 75   Ht 5\' 6"  (1.676 m)   Wt (!) 305 lb (138.3 kg)   BMI 49.23 kg/m  Physical Exam  Constitutional: He is oriented to person, place, and time. He appears well-developed and well-nourished. No distress.  Morbidly obese.  Well-groomed  HENT:  Head: Normocephalic and atraumatic.  Nose: Nose normal.  Mouth/Throat: No oropharyngeal exudate.  Eyes: Conjunctivae and EOM are normal. Pupils are equal, round, and reactive to light. No scleral icterus.  Neck: Normal range of motion. Neck supple. No hepatojugular reflux and no JVD present. Carotid bruit is not present. No tracheal deviation present. No thyromegaly present.  Cardiovascular: Exam reveals distant heart sounds. Exam reveals no gallop and no friction rub.  No murmur heard. Pulmonary/Chest: Effort normal and breath sounds normal. No respiratory distress. He has no wheezes. He has no rales.  Mild interstitial sounds  Abdominal: Soft. Bowel sounds are normal.  He exhibits no distension. There is no tenderness. There is no rebound.  Truncal obesity  Musculoskeletal: Normal range of motion. He exhibits edema (Trivial). He exhibits no deformity.  Lymphadenopathy:    He has no cervical adenopathy.  Neurological: He is alert and oriented to person, place, and time. No cranial nerve deficit. Coordination normal.  Skin: Skin is warm and dry. No rash noted. No erythema.  Psychiatric: He has a normal mood and affect. His behavior is normal. Judgment and thought content normal.    Adult ECG Report No EKG  Other studies Reviewed: Additional studies/ records that were reviewed today include:  Recent Labs: Labs checked today to confirm improvement of potassium.  Most recent potassium was 2.8 on November 12. Lab Results  Component Value Date   CREATININE 1.10 10/30/2017   BUN 10 10/30/2017   NA 143 10/30/2017   K 3.6 10/30/2017   CL 103 10/30/2017   CO2 26 10/30/2017   Chronically  elevated CK levels (early November).-Ranging from 220-304. -Negative troponin levels. November 12: CBC -WBC 6.1, H/H 17/47.8, platelet 166.   ASSESSMENT / PLAN: Problem List Items Addressed This Visit    Essential hypertension - Primary (Chronic)    Not as well-controlled today as one would anticipate.  Unfortunately had to stop the HCTZ.  I will list this as an allergy. With stopping HCTZ, will start losartan 25 mg daily.  He will follow-up with labs and a blood pressure check at [facility] ([facility] -pharmacist run hypertension, lipid and anticoagulation clinic). I suspect that he will probably need to have losartan further titrated. 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  Potassium levels in 2.7 range with palpitations it is very concerning. -Recheck labs immediately upon completion of his visit today.  Thankfully his potassium is up to 3.6.  I asked that he continue taking potassium until we can recheck in 2 weeks.  Plan: I will discontinue HCTZ, start losartan 25 mg daily.      Relevant Medications   potassium chloride SA (K-DUR,KLOR-CON) 20 MEQ tablet   Other Relevant Orders   Basic metabolic panel (Completed)   Comprehensive metabolic panel   Lipid panel   Morbid obesity (HCC)    The patient understands the need to lose weight with diet and exercise. We have discussed specific strategies for this.      Relevant Medications   aspirin EC 81 MG tablet   Other Relevant Orders   Comprehensive metabolic panel   Lipid panel   Screening for  hyperlipidemia    Obese young man with hypertension.   For additional risk stratification, will check lipid panel and chemistries.      Relevant Orders   Comprehensive metabolic panel   Lipid panel   Tobacco abuse counseling    I briefly discussed the importance of smoking cessation with him.  In the course of this initial visit, I did not have time to go into details.  Will discuss further and follow-up visits.  Current medicines are reviewed at length with the patient today. (+/- concerns) concerned about low potassium The following changes have been made:DC HCTZ  Patient Instructions  MEDICATION CHANGES  -- STOP TAKING HCTZ ( FLUID PILL)  --- START TAKING  LOSARTAN ONE TABLET DAILY     LABS-- [facility] OR [facility] TOMORROW---BMP  IN 2 WEEKS CMP LIPID  - DO NOT EAT OR DRINK THE MORNING OF THE TEST    Your physician recommends that you schedule a follow-up appointment in 2 WEEKS WITH [facility] - BLOOD PRESSURE -- PLEASE HAVE LAB WORK THAT DAY.     Your physician recommends that you schedule a follow-up appointment in 3 MONTHS WITH DR HARDING.    Studies Ordered:   Orders Placed This Encounter  Procedures  . Basic metabolic panel  . Comprehensive metabolic panel  . Lipid panel      Bryan Lemmaavid Harding, M.D., M.S. Interventional Cardiologist   Pager # [phone] Phone # [phone] [address] [place], [place] [address]

## 2017-10-29 NOTE — Patient Instructions (Signed)
MEDICATION CHANGES  -- STOP TAKING HCTZ ( FLUID PILL)  --- START TAKING  LOSARTAN ONE TABLET DAILY     LABS-- [facility] TOMORROW---BMP  IN 2 WEEKS CMP LIPID  - DO NOT EAT OR DRINK THE MORNING OF THE TEST    Your physician recommends that you schedule a follow-up appointment in 2 WEEKS WITH [facility] - BLOOD PRESSURE -- PLEASE HAVE LAB WORK THAT DAY.     Your physician recommends that you schedule a follow-up appointment in 3 MONTHS WITH DR HARDING.

## 2017-10-31 LAB — BASIC METABOLIC PANEL
BUN/Creatinine Ratio: 9 (ref 9–20)
BUN: 10 mg/dL (ref 6–20)
CO2: 26 mmol/L (ref 20–29)
CREATININE: 1.1 mg/dL (ref 0.76–1.27)
Calcium: 9.6 mg/dL (ref 8.7–10.2)
Chloride: 103 mmol/L (ref 96–106)
GFR, EST AFRICAN AMERICAN: 107 mL/min/{1.73_m2} (ref >59)
GFR, EST NON AFRICAN AMERICAN: 92 mL/min/{1.73_m2} (ref >59)
Glucose: 89 mg/dL (ref 65–99)
POTASSIUM: 3.6 mmol/L (ref 3.5–5.2)
SODIUM: 143 mmol/L (ref 134–144)

## 2017-11-01 ENCOUNTER — Encounter: Payer: Self-pay | Admitting: Cardiology

## 2017-11-01 DIAGNOSIS — Z716 Tobacco abuse counseling: Secondary | ICD-10-CM | POA: Insufficient documentation

## 2017-11-01 DIAGNOSIS — Z13220 Encounter for screening for lipoid disorders: Secondary | ICD-10-CM | POA: Insufficient documentation

## 2017-11-01 NOTE — Assessment & Plan Note (Signed)
The patient understands the need to lose weight with diet and exercise. We have discussed specific strategies for this.  

## 2017-11-01 NOTE — Assessment & Plan Note (Signed)
Obese young man with hypertension.   For additional risk stratification, will check lipid panel and chemistries.

## 2017-11-01 NOTE — Assessment & Plan Note (Signed)
Not as well-controlled today as one would anticipate.  Unfortunately had to stop the HCTZ.  I will list this as an allergy. With stopping HCTZ, will start losartan 25 mg daily.  He will follow-up with labs and a blood pressure check at [facility] ([facility] -pharmacist run hypertension, lipid and anticoagulation clinic). I suspect that he will probably need to have losartan further titrated. He is otherwise on current stable dose of metoprolol

## 2017-11-01 NOTE — Assessment & Plan Note (Signed)
I briefly discussed the importance of smoking cessation with him.  In the course of this initial visit, I did not have time to go into details.  Will discuss further and follow-up visits.

## 2017-11-01 NOTE — Assessment & Plan Note (Signed)
Amazing to me that he has been on HCTZ with hypokalemia STEMI times and this is never been stopped.  Potassium levels in 2.7 range with palpitations it is very concerning. -Recheck labs immediately upon completion of his visit today.  Thankfully his potassium is up to 3.6.  I asked that he continue taking potassium until we can recheck in 2 weeks.  Plan: I will discontinue HCTZ, start losartan 25 mg daily.

## 2017-11-03 ENCOUNTER — Telehealth: Payer: Self-pay | Admitting: Cardiology

## 2017-11-03 DIAGNOSIS — E876 Hypokalemia: Secondary | ICD-10-CM

## 2017-11-03 MED ORDER — POTASSIUM CHLORIDE ER 20 MEQ PO TBCR: 20.0000 meq | EXTENDED_RELEASE_TABLET | Freq: Every day | ORAL | 3 refills | Status: DC

## 2017-11-03 NOTE — Telephone Encounter (Signed)
Returned the call to the patient. He stated that he was calling for his lab results and to find out if he needs to continue his potassium supplement.   Per Dr. Herbie BaltimoreHarding, he should continue potassium 20 meq daily. He will his labs rechecked on 12/4 at his blood pressure check with pharmd. The potassium has been called in for him per his request.

## 2017-11-03 NOTE — Telephone Encounter (Signed)
Pt would like his lab results from lat week please.

## 2017-11-04 NOTE — Telephone Encounter (Signed)
Yes let us keep taking it for now.

## 2017-11-09 ENCOUNTER — Emergency Department (HOSPITAL_COMMUNITY): Payer: Medicaid Other

## 2017-11-09 ENCOUNTER — Emergency Department (HOSPITAL_COMMUNITY)
Admission: EM | Admit: 2017-11-09 | Discharge: 2017-11-10 | Disposition: A | Discharge status: Home / Self Care | Payer: Medicaid Other | Attending: Emergency Medicine | Admitting: Emergency Medicine

## 2017-11-09 ENCOUNTER — Encounter (HOSPITAL_COMMUNITY): Payer: Self-pay

## 2017-11-09 DIAGNOSIS — R079 Chest pain, unspecified: Secondary | ICD-10-CM

## 2017-11-09 DIAGNOSIS — F17210 Nicotine dependence, cigarettes, uncomplicated: Secondary | ICD-10-CM | POA: Insufficient documentation

## 2017-11-09 DIAGNOSIS — J45909 Unspecified asthma, uncomplicated: Secondary | ICD-10-CM | POA: Insufficient documentation

## 2017-11-09 DIAGNOSIS — R002 Palpitations: Secondary | ICD-10-CM | POA: Insufficient documentation

## 2017-11-09 DIAGNOSIS — Z79899 Other long term (current) drug therapy: Secondary | ICD-10-CM | POA: Insufficient documentation

## 2017-11-09 DIAGNOSIS — I10 Essential (primary) hypertension: Secondary | ICD-10-CM | POA: Insufficient documentation

## 2017-11-09 LAB — I-STAT TROPONIN, ED: TROPONIN I, POC: 0 ng/mL (ref 0.00–0.08)

## 2017-11-09 NOTE — ED Provider Notes (Signed)
[facility]-EMERGENCY DEPT Provider Note   CSN: [id number] Arrival date & time: 11/09/17  2048     History   Chief Complaint Chief Complaint  Patient presents with  . Palpitations    HPI Georgiann CockerHarry Roosevelt is a 26 y.o. male with PMH/o hypertension who presents emergency department for evaluation of palpitations that began at approximately 3 PM this afternoon.  Patient reports that palpitations have been intermittent since then but states that on ED arrival, they have resolved.  Patient reports he had one episode of chest pain at approximately 7pm.  He states that the pain was at one specific point and denies any radiation.  Denies any alleviating or aggravating factors. He states that that has resolved and denies any current chest pain or difficulty breathing.  Patient is followed by cardiology for evaluation of the palpitations.  He has never been on a Holter monitor.  Patient denies any recent caffeine intake.  Patient denies any illicit drug use.  Patient does smoke half a pack of cigarettes a day.  He denies any alcohol use.  Patient denies any recent fever, difficulty breathing, abdominal pain, nausea/vomiting.  Patient denies any personal history of MIs.  He denies any family history of early MIs at young age. He denies any hormone use, recent immobilization, prior history of DVT/PE, recent surgery, leg swelling, or long travel.  Patient does report that he recently had his high blood pressure medication change.  He was discontinued on the HCTZ and started on losartan and metoprolol.  The history is provided by the patient.    Past Medical History:  Diagnosis Date  . Asthma   . Hypertension 2011  . Hypokalemia due to loss of potassium    -While on HCTZ  . Morbid obesity [facility](HCC)     Patient Active Problem List   Diagnosis Date Noted  . Screening for hyperlipidemia 11/01/2017  . Morbid obesity (HCC) 11/01/2017  . Tobacco abuse counseling 11/01/2017  . Essential  hypertension 10/29/2017  . Hypokalemia due to loss of potassium - related ot HCTZ 10/29/2017    History reviewed. No pertinent surgical history.     Home Medications    Prior to Admission medications   Medication Sig Start Date End Date Taking? Authorizing Provider  albuterol (PROVENTIL HFA;VENTOLIN HFA) 108 (90 Base) MCG/ACT inhaler Inhale 1-2 puffs into the lungs every 6 (six) hours as needed for wheezing or shortness of breath.    [provider]  aspirin EC 81 MG tablet Take 81 mg daily as needed by mouth.    [provider]  losartan (COZAAR) 25 MG tablet Take 1 tablet (25 mg total) daily by mouth. 10/29/17 01/27/18  Marykay LexHarding, David W, MD  metoprolol tartrate (LOPRESSOR) 50 MG tablet Take 50 mg by mouth 2 (two) times daily.    [provider]  Potassium Chloride ER 20 MEQ TBCR Take 20 mEq daily by mouth. 11/03/17 02/01/18  Marykay LexHarding, David W, MD    Family History Family History  Problem Relation Age of Onset  . Diabetes Other   . Hypertension Other   . Hypertension Mother   . Arthritis Mother   . Hypertension Father   . Asthma Sister   . Diabetes Sister   . Hypertension Sister   . Asthma Brother   . Heart failure Maternal Grandmother   . Heart attack Maternal Grandfather [id number]    Social History Social History   Tobacco Use  . Smoking status: Current Every Day Smoker  Packs/day: 0.35    Types: Cigarettes  . Smokeless tobacco: Never Used  Substance Use Topics  . Alcohol use: No  . Drug use: No     Allergies   Fish allergy and Shrimp [shellfish allergy]   Review of Systems Review of Systems  Constitutional: Negative for fever.  Respiratory: Negative for cough and shortness of breath.   Cardiovascular: Positive for chest pain (resolved) and palpitations. Negative for leg swelling.  Gastrointestinal: Negative for abdominal pain, nausea and vomiting.  Genitourinary: Negative for dysuria and hematuria.  Neurological: Negative for  dizziness, weakness, numbness and headaches.     Physical Exam Updated Vital Signs BP 137/75 (BP Location: Right Arm)   Pulse 74   Temp 98.6 F (37 C) (Oral)   Resp 20   SpO2 100%   Physical Exam  Constitutional: He is oriented to person, place, and time. He appears well-developed and well-nourished.  Appears anxious but no acute distress  HENT:  Head: Normocephalic and atraumatic.  Mouth/Throat: Oropharynx is clear and moist and mucous membranes are normal.  Eyes: Conjunctivae, EOM and lids are normal. Pupils are equal, round, and reactive to light.  Neck: Full passive range of motion without pain.  Cardiovascular: Normal rate, regular rhythm, normal heart sounds and normal pulses. Exam reveals no gallop and no friction rub.  No murmur heard. Pulses:      Radial pulses are 2+ on the right side, and 2+ on the left side.  Pulmonary/Chest: Effort normal and breath sounds normal.  No evidence of respiratory distress. Able to speak in full sentences without difficulty.  Abdominal: Soft. Normal appearance. There is no tenderness. There is no rigidity and no guarding.  Musculoskeletal: Normal range of motion.  Neurological: He is alert and oriented to person, place, and time.  Skin: Skin is warm and dry. Capillary refill takes less than 2 seconds.  Psychiatric: He has a normal mood and affect. His speech is normal.  Nursing note and vitals reviewed.    ED Treatments / Results  Labs (all labs ordered are listed, but only abnormal results are displayed) Labs Reviewed  BASIC METABOLIC PANEL  CBC WITH DIFFERENTIAL/PLATELET  I-STAT TROPONIN, ED    EKG  EKG Interpretation  Date/Time:  Sunday November 09 2017 20:56:09 EST Ventricular Rate:  76 PR Interval:    QRS Duration: 84 QT Interval:  353 QTC Calculation: 397 R Axis:   65 Text Interpretation:  Sinus rhythm Borderline T wave abnormalities Baseline wander in lead(s) II III aVR aVF No significant change since last tracing  Confirmed by Allen, Anthony ([id number]) on 11/09/2017 9:38:15 PM       Radiology Dg Chest 2 View  Result Date: 11/09/2017 CLINICAL DATA:  Palpitation EXAM: CHEST  2 VIEW COMPARISON:  08/06/2017 FINDINGS: The heart size and mediastinal contours are within normal limits. Both lungs are clear. The visualized skeletal structures are unremarkable. IMPRESSION: No active cardiopulmonary disease. Electronically Signed   By: Kim  Fujinaga M.D.   On: 11/09/2017 22:55    Procedures Procedures (including critical care time)  Medications Ordered in ED Medications - No data to display   Initial Impression / Assessment and Plan / ED Course  I have reviewed the triage vital signs and the nursing notes.  Pertinent labs & imaging results that were available during my care of the patient were reviewed by me and considered in my medical decision making (see chart for details).     26  year old male with past medical history of hypertension and  palpitations who presents with palpitations that began approximately 3 PM this afternoon.  Patient reports that they were intermittent and states by the time he came to the emergency department, they had resolved.  Patient of one episode of chest pain in a probably 7 PM.  Patient states that the chest pain has resolved.  No shortness of breath, nausea/vomiting, diaphoresis.  Patient is followed by cardiology with Dr. Herbie BaltimoreHarding.  His cardiology recently discontinued his HCTZ and started him on losartan and metoprolol. Patient is afebrile, non-toxic appearing, sitting comfortably on examination table. Vital signs reviewed and stable.  Consider anxiety versus palpitations versus electrolyte imbalance.  Also consider ACS etiology, though low suspicion. Patient is PERC negative and is low risk for PE and does not need further testing.  Plan to check EKG, chest x-ray, basic labs.  EKG reviewed.  Sinus rhythm, rate 76 with borderline T wave abnormalities.  Appears to be consistent  with previous EKGs.  Chest x-ray negative for any acute infectious etiology.  Initial troponin is negative.  Patient signed out to [place] Russo, PA-C with labs pending.  Updated patient on plan.  Anticipate discharge after labs and delta troponin.  Encourage patient to follow-up with cardiologist regarding symptoms.  Final Clinical Impressions(s) / ED Diagnoses   Final diagnoses:  Palpitations  Chest pain, unspecified type    ED Discharge Orders    None       Rosana HoesLayden, Victorya Hillman A, PA-C 11/11/17 1627    Lorre NickAllen, Anthony, MD 11/17/17 [phone]

## 2017-11-09 NOTE — Discharge Instructions (Addendum)
As we discussed, you need to follow-up with your cardiologist regarding her symptoms today.  Call and arrange for an appointment.  Continue taking her high blood pressure medications as directed.  Return emergency department for any chest pain, difficulty breathing, palpitations, nausea/vomiting, or any other worsening or concerning symptoms.

## 2017-11-09 NOTE — ED Triage Notes (Signed)
Pt complains of heart palpitations and dizziness since earlier today Pt denies nausea or vomiting Pt has hx of the same with low potassium

## 2017-11-10 ENCOUNTER — Other Ambulatory Visit: Payer: Self-pay

## 2017-11-10 LAB — BASIC METABOLIC PANEL
ANION GAP: 5 (ref 5–15)
BUN: 9 mg/dL (ref 6–20)
CO2: 23 mmol/L (ref 22–32)
CREATININE: 1.1 mg/dL (ref 0.61–1.24)
Calcium: 8.7 mg/dL — ABNORMAL LOW (ref 8.9–10.3)
Chloride: 112 mmol/L — ABNORMAL HIGH (ref 101–111)
GFR calc Af Amer: >60 mL/min (ref >60)
Glucose, Bld: 82 mg/dL (ref 65–99)
Potassium: 4.2 mmol/L (ref 3.5–5.1)
Sodium: 140 mmol/L (ref 135–145)

## 2017-11-10 LAB — CBC WITH DIFFERENTIAL/PLATELET
BASOS ABS: 0 10*3/uL (ref 0.0–0.1)
Basophils Relative: 0 %
EOS ABS: 0.1 10*3/uL (ref 0.0–0.7)
EOS PCT: 1 %
HCT: 45.8 % (ref 39.0–52.0)
HEMOGLOBIN: 16.2 g/dL (ref 13.0–17.0)
LYMPHS ABS: 2.6 10*3/uL (ref 0.7–4.0)
LYMPHS PCT: 55 %
MCH: 31.7 pg (ref 26.0–34.0)
MCHC: 35.4 g/dL (ref 30.0–36.0)
MCV: 89.6 fL (ref 78.0–100.0)
Monocytes Absolute: 0.3 10*3/uL (ref 0.1–1.0)
Monocytes Relative: 6 %
NEUTROS PCT: 38 %
Neutro Abs: 1.8 10*3/uL (ref 1.7–7.7)
PLATELETS: 171 10*3/uL (ref 150–400)
RBC: 5.11 MIL/uL (ref 4.22–5.81)
RDW: 13.9 % (ref 11.5–15.5)
WBC: 4.8 10*3/uL (ref 4.0–10.5)

## 2017-11-10 LAB — I-STAT TROPONIN, ED: TROPONIN I, POC: 0 ng/mL (ref 0.00–0.08)

## 2017-11-10 NOTE — ED Provider Notes (Signed)
Care assumed from [facility] Layden, PA-C, pending CBC, BMP results and repeat troponin.  The patient with past medical history of hypertension, presenting for palpitations and an episode of chest pain.  Troponin and chest x-ray negative.  Vital signs stable.  PERC negative.  Anticipate discharge with cardiology.  3:17 AM CBC and BMP unremarkable.  Delta troponin negative.  Discussed results with patient recommended cardiology follow-up.  Patient is safe for discharge at this time.  Discussed results, findings, treatment and follow up. Patient advised of return precautions. Patient verbalized understanding and agreed with plan.    Zenna Traister, [place] N, PA-C 11/10/17 [phone]    Lorre NickAllen, Anthony, MD 11/12/17 [phone]

## 2017-11-18 ENCOUNTER — Ambulatory Visit (INDEPENDENT_AMBULATORY_CARE_PROVIDER_SITE_OTHER): Payer: Self-pay | Admitting: Pharmacist

## 2017-11-18 ENCOUNTER — Encounter: Payer: Self-pay | Admitting: Pharmacist

## 2017-11-18 VITALS — BP 132/88 | HR 68 | Wt 310.6 lb

## 2017-11-18 DIAGNOSIS — I10 Essential (primary) hypertension: Secondary | ICD-10-CM

## 2017-11-18 LAB — BASIC METABOLIC PANEL
BUN/Creatinine Ratio: 9 (ref 9–20)
BUN: 10 mg/dL (ref 6–20)
CO2: 21 mmol/L (ref 20–29)
CREATININE: 1.07 mg/dL (ref 0.76–1.27)
Calcium: 9.5 mg/dL (ref 8.7–10.2)
Chloride: 104 mmol/L (ref 96–106)
GFR calc Af Amer: 110 mL/min/{1.73_m2} (ref >59)
GFR, EST NON AFRICAN AMERICAN: 95 mL/min/{1.73_m2} (ref >59)
GLUCOSE: 72 mg/dL (ref 65–99)
Potassium: 4.5 mmol/L (ref 3.5–5.2)
Sodium: 141 mmol/L (ref 134–144)

## 2017-11-18 NOTE — Patient Instructions (Addendum)
Return for a  follow up appointment as needed   Your blood pressure today is 132/88 pulse 68  Check your blood pressure at home daily (if able) and keep record of the readings.  Take your BP meds as follows: **ALL medication as prescribed**  Bring all of your meds, your BP cuff and your record of home blood pressures to your next appointment.  Exercise as you're able, try to walk approximately 30 minutes per day.  Keep salt intake to a minimum, especially watch canned and prepared boxed foods.  Eat more fresh fruits and vegetables and fewer canned items.  Avoid eating in fast food restaurants.    HOW TO TAKE YOUR BLOOD PRESSURE: . Rest 5 minutes before taking your blood pressure. .  Don't smoke or drink caffeinated beverages for at least 30 minutes before. . Take your blood pressure before (not after) you eat. . Sit comfortably with your back supported and both feet on the floor (don't cross your legs). . Elevate your arm to heart level on a table or a desk. . Use the proper sized cuff. It should fit smoothly and snugly around your bare upper arm. There should be enough room to slip a fingertip under the cuff. The bottom edge of the cuff should be 1 inch above the crease of the elbow. . Ideally, take 3 measurements at one sitting and record the average.

## 2017-11-18 NOTE — Progress Notes (Signed)
Patient ID: Kenneth CockerHarry Willis                 DOB: [birth year]                      MRN: [id number]     HPI: Kenneth Willis is a 26 y.o. male referred by Dr. Herbie BaltimoreHarding to HTN clinic.  PMH includes palpitations, hypokalemia, hypertension, obesity, and asthma. Noted multiple emergency room visits for palpitations and chest pain. Patient needs to establish with PCP and is very fearful of having MI due to recent death of friend. During most recent office visit with DR Herbie BaltimoreHarding his HCTZ was discontinued due to hypokalemia and replaced by losartan 25mg  daily. He reports less palpitations, some fatigue but no significant health changes. Patient is currently working on decreasing sodium intake, weight loss, and less calories intake.  He is trying to quit smoking but not interested on medication at this time.   Current HTN meds:  Metoprolol tartrate 50mg  twice daily Losartan 25mg  daily  Previously tried:  HCTZ - hypokalemia  BP goal: 130/80  Family History: Arthritis in his mother; Asthma in his brother and sister; Diabetes in his other and sister; Heart attack (age of onset: 9079) in his maternal grandfather; Heart failure in his maternal grandmother; Hypertension in his father, mother, other, and sister  Social History: current smoker, denies alcohol use or other illicit drugs  Diet: lowe sodium intake, less calories, working on weight  Exercise: minimal in addition to activities of daily leaving  Home BP readings: non available; no home BP monitor  Wt Readings from Last 3 Encounters:  11/18/17 (!) 310 lb 9.6 oz (140.9 kg)  11/10/17 (!) 305 lb (138.3 kg)  10/29/17 (!) 305 lb (138.3 kg)   BP Readings from Last 3 Encounters:  11/18/17 132/88  11/10/17 122/84  10/29/17 (!) 142/101   Pulse Readings from Last 3 Encounters:  11/18/17 68  11/10/17 77  10/29/17 75    Past Medical History:  Diagnosis Date  . Asthma   . Hypertension 2011  . Hypokalemia due to loss of potassium    -While on HCTZ    . Morbid obesity (HCC)     Current Outpatient Medications on File Prior to Visit  Medication Sig Dispense Refill  . albuterol (PROVENTIL HFA;VENTOLIN HFA) 108 (90 Base) MCG/ACT inhaler Inhale 1-2 puffs into the lungs every 6 (six) hours as needed for wheezing or shortness of breath.    . losartan (COZAAR) 25 MG tablet Take 1 tablet (25 mg total) daily by mouth. 30 tablet 6  . metoprolol tartrate (LOPRESSOR) 50 MG tablet Take 50 mg by mouth 2 (two) times daily.     No current facility-administered medications on file prior to visit.     Allergies  Allergen Reactions  . Fish Allergy Swelling  . Shrimp [Shellfish Allergy] Swelling    Blood pressure 132/88, pulse 68, weight (!) 310 lb 9.6 oz (140.9 kg), SpO2 99 %.  Essential hypertension Blood pressure is better controlled today but remains slightly above goal of <130/80. Patient also continues to smoke daily and not ready to quit yet. Will repeat BMET today with plans to increase losartan to 25mg  twice daily if renal function remains stable. Will follow up by phone in 3 weeks, then schedule next office visit as needed but patient is not insured and is unable  to come to clinic frequently    Kenneth Willis PharmD, BCPS, CPP [facility] [id number] [place] [phone] 11/18/2017 8:46 PM

## 2017-11-18 NOTE — Assessment & Plan Note (Signed)
Blood pressure is better controlled today but remains slightly above goal of <130/80. Patient also continues to smoke daily and not ready to quit yet. Will repeat BMET today with plans to increase losartan to 25mg  twice daily if renal function remains stable. Will follow up by phone in 3 weeks, then schedule next office visit as needed but patient is not insured and is unable  to come to clinic frequently

## 2017-11-19 ENCOUNTER — Telehealth: Payer: Self-pay | Admitting: Pharmacist

## 2017-11-19 MED ORDER — LOSARTAN POTASSIUM 25 MG PO TABS: 25.0000 mg | ORAL_TABLET | Freq: Two times a day (BID) | ORAL | 6 refills | Status: DC

## 2017-11-19 NOTE — Telephone Encounter (Signed)
LMOM; patient to call back for results and further instrcutions.   BMET resulted. Renal function and electrolytes are within normal limits.  Please increase losartan to 25mg  twice daily and monitor BP 2-3x/week

## 2017-11-20 ENCOUNTER — Encounter (HOSPITAL_COMMUNITY): Payer: Self-pay

## 2017-11-20 ENCOUNTER — Other Ambulatory Visit: Payer: Self-pay

## 2017-11-20 ENCOUNTER — Emergency Department (HOSPITAL_COMMUNITY)
Admission: EM | Admit: 2017-11-20 | Discharge: 2017-11-20 | Disposition: A | Discharge status: Home / Self Care | Payer: Medicaid Other | Attending: Emergency Medicine | Admitting: Emergency Medicine

## 2017-11-20 DIAGNOSIS — Z79899 Other long term (current) drug therapy: Secondary | ICD-10-CM | POA: Insufficient documentation

## 2017-11-20 DIAGNOSIS — I10 Essential (primary) hypertension: Secondary | ICD-10-CM | POA: Insufficient documentation

## 2017-11-20 DIAGNOSIS — F17210 Nicotine dependence, cigarettes, uncomplicated: Secondary | ICD-10-CM | POA: Insufficient documentation

## 2017-11-20 DIAGNOSIS — J45909 Unspecified asthma, uncomplicated: Secondary | ICD-10-CM | POA: Insufficient documentation

## 2017-11-20 DIAGNOSIS — J039 Acute tonsillitis, unspecified: Secondary | ICD-10-CM

## 2017-11-20 LAB — RAPID STREP SCREEN (MED CTR MEBANE ONLY): Streptococcus, Group A Screen (Direct): NEGATIVE

## 2017-11-20 LAB — MONONUCLEOSIS SCREEN: MONO SCREEN: NEGATIVE

## 2017-11-20 MED ORDER — AMOXICILLIN 500 MG PO CAPS: 500.0000 mg | ORAL_CAPSULE | Freq: Three times a day (TID) | ORAL | 0 refills | Status: AC

## 2017-11-20 MED ORDER — DEXAMETHASONE 2 MG PO TABS
10.0000 mg | ORAL_TABLET | Freq: Once | ORAL | Status: AC
  Administered 2017-11-20: 10 mg via ORAL
  Filled 2017-11-20: qty 2

## 2017-11-20 NOTE — ED Triage Notes (Signed)
Patient presents with cold-like symptoms and sore throat starting on Monday 12/3. Patient reports his throat "feels like Im swallowing knives." Patient managing airway without difficulty.

## 2017-11-20 NOTE — Discharge Instructions (Signed)
Take tylenol and ibuprofen regularly for pain and/or fever. Use salt water gargles and use Chloraseptic spray to help with the pain. Follow up with your doctor. Return here for worsening symptoms

## 2017-11-20 NOTE — ED Provider Notes (Signed)
[facility]-EMERGENCY DEPT Provider Note   CSN: [id number] Arrival date & time: 11/20/17  1716     History   Chief Complaint Chief Complaint  Patient presents with  . Sore Throat    HPI Kenneth Willis is a 26 y.o. male who presents to the ED with URI symptoms. Patient reports that he began with sore throat 3 days ago. Patient states it feels like razor blades going down his throat.  The history is provided by the patient. No language interpreter was used.  Sore Throat  This is a new problem. The current episode started more than 2 days ago. The problem has been gradually worsening. Associated symptoms include headaches. Pertinent negatives include no chest pain, no abdominal pain and no shortness of breath. Nothing relieves the symptoms. He has tried nothing for the symptoms.    Past Medical History:  Diagnosis Date  . Asthma   . Hypertension 2011  . Hypokalemia due to loss of potassium    -While on HCTZ  . Morbid obesity [facility](HCC)     Patient Active Problem List   Diagnosis Date Noted  . Screening for hyperlipidemia 11/01/2017  . Morbid obesity (HCC) 11/01/2017  . Tobacco abuse counseling 11/01/2017  . Essential hypertension 10/29/2017  . Hypokalemia due to loss of potassium - related ot HCTZ 10/29/2017    History reviewed. No pertinent surgical history.     Home Medications    Prior to Admission medications   Medication Sig Start Date End Date Taking? Authorizing Provider  albuterol (PROVENTIL HFA;VENTOLIN HFA) 108 (90 Base) MCG/ACT inhaler Inhale 1-2 puffs into the lungs every 6 (six) hours as needed for wheezing or shortness of breath.    [provider]  amoxicillin (AMOXIL) 500 MG capsule Take 1 capsule (500 mg total) by mouth 3 (three) times daily. 11/20/17   Janne NapoleonNeese, Hope M, NP  losartan (COZAAR) 25 MG tablet Take 1 tablet (25 mg total) by mouth 2 (two) times daily. 11/19/17   Marykay LexHarding, David W, MD  metoprolol tartrate (LOPRESSOR) 50  MG tablet Take 50 mg by mouth 2 (two) times daily.    [provider]    Family History Family History  Problem Relation Age of Onset  . Diabetes Other   . Hypertension Other   . Hypertension Mother   . Arthritis Mother   . Hypertension Father   . Asthma Sister   . Diabetes Sister   . Hypertension Sister   . Asthma Brother   . Heart failure Maternal Grandmother   . Heart attack Maternal Grandfather [phone]    Social History Social History   Tobacco Use  . Smoking status: Current Every Day Smoker    Packs/day: 0.35    Types: Cigarettes  . Smokeless tobacco: Never Used  Substance Use Topics  . Alcohol use: No  . Drug use: No     Allergies   Fish allergy and Shrimp [shellfish allergy]   Review of Systems Review of Systems  Constitutional: Positive for fatigue. Negative for chills and fever.  HENT: Positive for congestion and sore throat. Negative for mouth sores, sinus pressure and sinus pain.   Eyes: Negative for pain, redness, itching and visual disturbance.  Respiratory: Negative for chest tightness and shortness of breath. Cough: occasional.   Cardiovascular: Negative for chest pain.  Gastrointestinal: Positive for nausea. Negative for abdominal pain and vomiting.  Genitourinary: Negative for dysuria and frequency.  Musculoskeletal: Negative for back pain and neck stiffness.  Skin: Negative for rash.  Neurological: Positive for headaches. Negative for dizziness and syncope.  Hematological: Positive for adenopathy.  Psychiatric/Behavioral: Negative for confusion. The patient is not nervous/anxious.      Physical Exam Updated Vital Signs BP 130/82 (BP Location: Right Arm)   Pulse 77   Temp 98.5 F (36.9 C) (Oral)   Resp 18   SpO2 100%   Physical Exam  Constitutional: He is oriented to person, place, and time. He appears well-developed and well-nourished. He does not appear ill. No distress.  HENT:  Head: Normocephalic.  Right Ear: Tympanic  membrane normal.  Left Ear: Tympanic membrane normal.  Nose: Rhinorrhea present.  Mouth/Throat: Uvula is midline and mucous membranes are normal. Oropharyngeal exudate and posterior oropharyngeal erythema present. No tonsillar abscesses.  Throat is beefy red there is tonsillar exudate noted. No abscess.   Eyes: EOM are normal. Pupils are equal, round, and reactive to light.  Neck: Normal range of motion. Neck supple.  Cardiovascular: Normal rate and regular rhythm.  Pulmonary/Chest: Effort normal and breath sounds normal. No respiratory distress.  Abdominal: Soft. There is no tenderness.  Musculoskeletal: Normal range of motion.  Neurological: He is alert and oriented to person, place, and time. No cranial nerve deficit.  Skin: Skin is warm and dry.  Psychiatric: He has a normal mood and affect. His behavior is normal.  Nursing note and vitals reviewed.    ED Treatments / Results  Labs (all labs ordered are listed, but only abnormal results are displayed) Labs Reviewed  RAPID STREP SCREEN (NOT [facility])  CULTURE, GROUP A STREP [facility])  MONONUCLEOSIS SCREEN    Radiology No results found.  Procedures Procedures (including critical care time)  Medications Ordered in ED Medications  dexamethasone (DECADRON) tablet 10 mg (10 mg Oral Given 11/20/17 1931)     Initial Impression / Assessment and Plan / ED Course  I have reviewed the triage vital signs and the nursing notes. 26 y.o. male with sore throat and tonsillar exudate. Negative strep, negative mono.  Will treat for tonsillitis. Discussed with the patient f/u with PCP and return precautions. Patient agrees with plan. Stable for d/c without difficulty swallowing and no fever. Patient does not appear toxic.   Final Clinical Impressions(s) / ED Diagnoses   Final diagnoses:  Tonsillitis with exudate    ED Discharge Orders        Ordered    amoxicillin (AMOXIL) 500 MG capsule  3 times daily     11/20/17 2031       Kerrie Buffaloeese,  Hope BaronM, [place]NP 11/20/17 2035    Melene PlanFloyd, Dan, DO 11/20/17 2202

## 2017-11-23 LAB — CULTURE, GROUP A STREP (THRC)

## 2017-11-26 ENCOUNTER — Other Ambulatory Visit: Payer: Self-pay | Admitting: Pharmacist

## 2017-11-26 MED ORDER — LOSARTAN POTASSIUM 25 MG PO TABS: 25.0000 mg | ORAL_TABLET | Freq: Two times a day (BID) | ORAL | 4 refills | Status: DC

## 2017-12-19 ENCOUNTER — Ambulatory Visit: Payer: Medicaid Other | Admitting: Adult Health

## 2017-12-19 NOTE — Progress Notes (Deleted)
Cardiology Office Note   Date:  12/19/2017   ID:  Kenneth Willis, DOB November 11, 1991, MRN [id number]  PCP:  Patient, [name]  Cardiologist: Dr. Herbie Baltimore No chief complaint on file.    History of Present Illness: Kenneth Willis is a 27 y.o. male who presents for ongoing assessment and management of palpitations, with history of hypertension, and hypokalemia.  The patient visits the emergency room fairly often for complaints of palpitations and chest discomfort.  Is also been followed by Dr. Houston Siren cardiology in [facility].   Echo Jan 2018 - [facility] Ebony Cargo): Normal LV size and function.  EF 60-65%.  Mild to moderate LVH.  GR 1 DD.  Trace TR.  Normal valves otherwise  Patient was last seen on 10/29/2017, and had only minor complaints of palpitations.  This usually occurs when he is hypokalemic.  5 mg daily with follow-up labs and blood pressure check, and was referred to pharmacy for hypertension and lipids along with anticoagulation.  He was continued on potassium aspirin, HCTZ was discontinued in the setting of chronic hypokalemia.  Follow-up labs were completed on 11/18/2017, sodium 141, potassium 4.5, chloride 104, CO2 21, glucose 72, creatinine 1.07, Past Medical History:  Diagnosis Date  . Asthma   . Hypertension 2011  . Hypokalemia due to loss of potassium    -While on HCTZ  . Morbid obesity (HCC)     No past surgical history on file.   Current Outpatient Medications  Medication Sig Dispense Refill  . albuterol (PROVENTIL HFA;VENTOLIN HFA) 108 (90 Base) MCG/ACT inhaler Inhale 1-2 puffs into the lungs every 6 (six) hours as needed for wheezing or shortness of breath.    Marland Kitchen amoxicillin (AMOXIL) 500 MG capsule Take 1 capsule (500 mg total) by mouth 3 (three) times daily. 30 capsule 0  . losartan (COZAAR) 25 MG tablet Take 1 tablet (25 mg total) by mouth 2 (two) times daily. 60 tablet 4  . metoprolol tartrate (LOPRESSOR) 50 MG tablet Take 50 mg by mouth 2 (two)  times daily.     No current facility-administered medications for this visit.     Allergies:   Fish allergy and Shrimp [shellfish allergy]    Social History:  The patient  reports that he has been smoking cigarettes.  He has been smoking about 0.35 packs per day. he has never used smokeless tobacco. He reports that he does not drink alcohol or use drugs.   Family History:  The patient's family history includes Arthritis in his mother; Asthma in his brother and sister; Diabetes in his other and sister; Heart attack (age of onset: 16) in his maternal grandfather; Heart failure in his maternal grandmother; Hypertension in his father, mother, other, and sister.    ROS: All other systems are reviewed and negative. Unless otherwise mentioned in H&P    PHYSICAL EXAM: VS:  There were no vitals taken for this visit. , BMI There is no height or weight on file to calculate BMI. GEN: Well nourished, well developed, in no acute distress  HEENT: normal  Neck: no JVD, carotid bruits, or masses Cardiac: ***RRR; no murmurs, rubs, or gallops,no edema  Respiratory:  clear to auscultation bilaterally, normal work of breathing GI: soft, nontender, nondistended, + BS MS: no deformity or atrophy  Skin: warm and dry, no rash Neuro:  Strength and sensation are intact Psych: euthymic mood, full affect   EKG:  EKG {ACTION; IS/IS [id number]} ordered today. The ekg ordered today demonstrates ***  Recent Labs: 11/09/2017: Hemoglobin 16.2; Platelets 171 11/18/2017: BUN 10; Creatinine, Ser 1.07; Potassium 4.5; Sodium 141    Lipid Panel No results found for: CHOL, TRIG, HDL, CHOLHDL, VLDL, LDLCALC, LDLDIRECT    Wt Readings from Last 3 Encounters:  11/18/17 (!) 310 lb 9.6 oz (140.9 kg)  11/10/17 (!) 305 lb (138.3 kg)  10/29/17 (!) 305 lb (138.3 kg)      Other studies Reviewed: Additional studies/ records that were reviewed today include: ***. Review of the above records demonstrates:  ***   ASSESSMENT AND PLAN:  1.  ***   Current medicines are reviewed at length with the patient today.    Labs/ tests ordered today include: *** Bettey MareKathryn M. Liborio NixonLawrence DNP, ANP, AACC   12/19/2017 7:47 AM    [facility] [address], [place], [place] [address] Phone: [phone]; Fax: [phone]

## 2017-12-22 ENCOUNTER — Ambulatory Visit (INDEPENDENT_AMBULATORY_CARE_PROVIDER_SITE_OTHER): Payer: Self-pay | Admitting: Adult Health

## 2017-12-22 ENCOUNTER — Encounter: Payer: Self-pay | Admitting: Adult Health

## 2017-12-22 VITALS — BP 142/76 | HR 62 | Ht 66.0 in | Wt 309.0 lb

## 2017-12-22 DIAGNOSIS — Z716 Tobacco abuse counseling: Secondary | ICD-10-CM

## 2017-12-22 DIAGNOSIS — Z79899 Other long term (current) drug therapy: Secondary | ICD-10-CM

## 2017-12-22 DIAGNOSIS — R079 Chest pain, unspecified: Secondary | ICD-10-CM

## 2017-12-22 DIAGNOSIS — R400 Somnolence: Secondary | ICD-10-CM

## 2017-12-22 DIAGNOSIS — I10 Essential (primary) hypertension: Secondary | ICD-10-CM

## 2017-12-22 NOTE — Progress Notes (Signed)
Cardiology Office Note   Date:  12/22/2017   ID:  Kenneth CockerHarry Willis, DOB 03/07/1991, MRN [id number]  PCP:  Patient, [name]  Cardiologist: Dr Herbie BaltimoreHarding  Chief Complaint  Patient presents with  . Hypertension  . Palpitations  . Chest Pain     History of Present Illness: Kenneth Willis is a 27 y.o. male who presents for ongoing assessment and management of hypertension, with history of palpitations, and hypokalemia.  The patient has been seen to the emergency room on several occasions for recurrent palpitations or chest discomfort.  Per Dr. Elissa HeftyHarding's note these have been occurring dating back from February 2018.  The patient was also [place].  His was recently established in our practice November 2018.  Recent Hospitalizations:   October 05, 2017 [facility]) --noted 1 day history of intermittent lancing chest discomfort migratory throughout the chest.  Also noted couple weeks of palpitations usually associated with hypokalemia. -->  Potassium 2.9 (was 2.7 on October 7)  October 27, 2017 [facility]) -again palpitations happening when he tried to sleep.  Also feeling in the anterior portion of his chest.  Again noted to be hypokalemic (2.8).  Given potassium.  Was yet again referred to cardiology follow-up.  Studies Personally Reviewed - (if available, images/films reviewed: From Epic Chart or Care Everywhere)  Echo Jan 2018 - [facility]ony Cargo([place]): Normal LV size and function.  EF 60-65%.  Mild to moderate LVH.  GR 1 DD.  Trace TR.  Normal valves otherwise  Last office visit 10/29/2017, the patient was started on losartan, HCTZ was discontinued due to allergies.  He was continued on metoprolol.  He was placed on potassium 20 mg daily and was to continue this uninterrupted.  He was recently seen in the emergency room again  with complaints of a sore throat.  He was started on antibiotics for tonsillitis and  was to return to see primary care physician for ongoing management.  He unfortunately continues to smoke.  His father recently had a myocardial infarction and this has gotent him very anxious.  Patient is also complaining of generalized fatigue during the day and not sleeping well at night.  He has never had a sleep study.  He also states when he was younger he was on thyroid medication and is no longer on it is uncertain why.  Past Medical History:  Diagnosis Date  . Asthma   . Hypertension 2011  . Hypokalemia due to loss of potassium    -While on HCTZ  . Morbid obesity (HCC)     No past surgical history on file.   Current Outpatient Medications  Medication Sig Dispense Refill  . albuterol (PROVENTIL HFA;VENTOLIN HFA) 108 (90 Base) MCG/ACT inhaler Inhale 1-2 puffs into the lungs every 6 (six) hours as needed for wheezing or shortness of breath.    . losartan (COZAAR) 25 MG tablet Take 1 tablet (25 mg total) by mouth 2 (two) times daily. 60 tablet 4  . metoprolol tartrate (LOPRESSOR) 50 MG tablet Take 50 mg by mouth 2 (two) times daily.    Marland Kitchen. amoxicillin (AMOXIL) 500 MG capsule Take 1 capsule (500 mg total) by mouth 3 (three) times daily. 30 capsule 0   No current facility-administered medications for this visit.     Allergies:   Fish allergy and Shrimp [shellfish allergy]    Social History:  The patient  reports that he has been smoking cigarettes.  He  has been smoking about 0.35 packs per day. he has never used smokeless tobacco. He reports that he does not drink alcohol or use drugs.   Family History:  The patient's family history includes Arthritis in his mother; Asthma in his brother and sister; Diabetes in his other and sister; Heart attack (age of onset: 45) in his maternal grandfather; Heart failure in his maternal grandmother; Hypertension in his father, mother, other, and sister.    ROS: All other systems are reviewed and negative. Unless otherwise mentioned in H&P     PHYSICAL EXAM: VS:  BP (!) 142/76   Pulse 62   Ht 5\' 6"  (1.676 m)   Wt (!) 309 lb (140.2 kg)   BMI 49.87 kg/m  , BMI Body mass index is 49.87 kg/m. GEN: Well nourished, well developed, in no acute distress Morbidly obese HEENT: normal Wears glasses Neck: no JVD, carotid bruits, or masses Cardiac: RRR; no murmurs, rubs, or gallops,no edema  Respiratory:  clear to auscultation bilaterally, normal work of breathing GI: soft, nontender, nondistended, + BS MS: no deformity or atrophy  Skin: warm and dry, no rash Neuro:  Strength and sensation are intact Psych: euthymic mood, full affect   EKG: Normal sinus rhythm heart rate 62 bpm  Recent Labs: 11/09/2017: Hemoglobin 16.2; Platelets 171 11/18/2017: BUN 10; Creatinine, Ser 1.07; Potassium 4.5; Sodium 141    Lipid Panel No results found for: CHOL, TRIG, HDL, CHOLHDL, VLDL, LDLCALC, LDLDIRECT    Wt Readings from Last 3 Encounters:  12/22/17 (!) 309 lb (140.2 kg)  11/18/17 (!) 310 lb 9.6 oz (140.9 kg)  11/10/17 (!) 305 lb (138.3 kg)     ASSESSMENT AND PLAN:  1.  Recurrent chest pain: Atypical.  Has been having this discomfort in the chest for 4 years.  He states that the pain moves around and is described as sharp.  It is not associated with smoking.  He states he can have the chest discomfort whether he has recently smoked a cigarette or not.  The pain is fleeting and goes away on its own.  Has had several tests in the past which were found to be negative.  2.  Chronic fatigue with some shortness of breath.  The patient will have a sleep study completed due to morbid obesity, dyspnea during the day with easily being sleepy and fatigued.  His mother has recently had a sleep study in herself and he is familiar with this process.  3.  History of thyroid disease as a child: Patient does not have thyromegaly.  I do not find where he has had a TSH completed during recent ER visits and hospitalizations.  I will check this due to his  history.  4.  Ongoing tobacco abuse: I have counseled the patient on smoking cessation as he may be having some vasospasms associated with nicotine.  He does have an inhaler which helps sometimes.  He verbalizes understanding and is aware of the dangers of tobacco abuse.  5.  Morbid obesity: Advised the patient to increase his exercise and begin calorie reduction.  I would like to see him lose a minimum of 50 pounds, but he could probably lose up to 80 pounds.  Will start small and see if he can lose weight with increased exercise and decreased calories.  As stated above will check TSH to evaluate this as an impediment to losing weight.  6.  Chronic anxiety: Self-admitted.  He states he worries a lot about his health and recently  his father having had an MI is gone have concerned about his own chest discomfort which is continuing.  I asked him to speak to a counselor or trusted friend to help him with his worries and to allow him to process his feelings.  He verbalizes understanding   Current medicines are reviewed at length with the patient today.    Labs/ tests ordered today include: TSH, sleep study.  Bettey Mare. Liborio Nixon, ANP, AACC   12/22/2017 4:32 PM    [facility] [address], [place], [place] [address] Phone: [phone]; Fax: [phone]

## 2017-12-22 NOTE — Patient Instructions (Signed)
Medication Instructions:  NO CHANGES-Your physician recommends that you continue on your current medications as directed. Please refer to the Current Medication list given to you today.  If you need a refill on your cardiac medications before your next appointment, please call your pharmacy.  Labwork: TSH TODAY HERE IN OUR OFFICE AT [facility]  Testing/Procedures: Your physician has recommended that you have a sleep study. This test records several body functions during sleep, including: brain activity, eye movement, oxygen and carbon dioxide blood levels, heart rate and rhythm, breathing rate and rhythm, the flow of air through your mouth and nose, snoring, body muscle movements, and chest and belly movement.  Follow-Up: Your physician wants you to follow-up in: AFTER SLEEP STUDY.   Thank you for choosing [facility] at [facility]!!     Sleep Study  Georgiann CockerHarry Duggar scheduled for a sleep study on       at          . You will leave next morning between 5:00 a.m - 7:00 a.m.  The sleep study consists of a recording of your brain waves (EEG). Breathing, heart rate and rhythm (ECG), oxygen level, eye movement, and leg movement.  The technician will glue or or paste several electrodes to your scalp, face, chest and legs.  You will have belts around your chest and abdomen to record breathing and a finger clasp to check blood oxygen levels.  A tube at your mouth and nose will detect airflow.  There are no needle sticks or painful procedures of any sort.  You will have your own room, and we will make every effort to attend to your comfort and privacy.  Please prepare for your study by the following steps:   Please avoid coffee, tea, soda, chocolate and other caffeine foods or beverages after 12:00 noon on the day of your sleep study.   You must arrive with clean (no oils), conditioners or make up, and please make sure that you wash your hair to ensure that your hair and scalp are clean, dry and  free of any hair extensions on the day of your study.  This will help to get a good reading of study.  Please try not to nap on the day of your study.  Please bring a list of all your medications.  Bring any medications that you might need during the time you are within the laboratory, including insulin, sleeping pills, pain medication and anxiety medications.  Bring snacks, water or juice  Please bring clothes to sleep in and your normal overnight bag.  Please leave valuable at home, as we will not be responsible for any lost items.  If you have any further questions, please feel free to call our office. Thank you  Please call our office 48 in advance to cancel or reschedule to avoid a $100.00 early cancel or no show fee.

## 2018-01-08 ENCOUNTER — Other Ambulatory Visit: Payer: Self-pay | Admitting: Pharmacist Clinician (PhC)/ Clinical Pharmacy Specialist

## 2018-01-08 MED ORDER — METOPROLOL TARTRATE 50 MG PO TABS: 50.0000 mg | ORAL_TABLET | Freq: Two times a day (BID) | ORAL | 1 refills | Status: DC

## 2018-01-08 MED ORDER — LOSARTAN POTASSIUM 25 MG PO TABS: 25.0000 mg | ORAL_TABLET | Freq: Two times a day (BID) | ORAL | 1 refills | Status: DC

## 2018-01-13 ENCOUNTER — Ambulatory Visit (HOSPITAL_BASED_OUTPATIENT_CLINIC_OR_DEPARTMENT_OTHER): Payer: Self-pay | Attending: Adult Health | Admitting: Cardiovascular Disease

## 2018-01-13 VITALS — Ht 66.0 in | Wt 309.0 lb

## 2018-01-13 DIAGNOSIS — G4719 Other hypersomnia: Secondary | ICD-10-CM | POA: Insufficient documentation

## 2018-01-13 DIAGNOSIS — Z79899 Other long term (current) drug therapy: Secondary | ICD-10-CM | POA: Insufficient documentation

## 2018-01-13 DIAGNOSIS — G4733 Obstructive sleep apnea (adult) (pediatric): Secondary | ICD-10-CM | POA: Insufficient documentation

## 2018-01-13 DIAGNOSIS — R400 Somnolence: Secondary | ICD-10-CM

## 2018-01-13 DIAGNOSIS — R0683 Snoring: Secondary | ICD-10-CM | POA: Insufficient documentation

## 2018-01-13 DIAGNOSIS — R51 Headache: Secondary | ICD-10-CM | POA: Insufficient documentation

## 2018-01-25 ENCOUNTER — Encounter (HOSPITAL_BASED_OUTPATIENT_CLINIC_OR_DEPARTMENT_OTHER): Payer: Self-pay | Admitting: Cardiovascular Disease

## 2018-01-25 NOTE — Procedures (Signed)
Patient Name: Kenneth Willis, Callum Study Date: 01/13/2018 Gender: Male D.O.B: August 08, 1991 Age (years): 26 Referring Provider: Joni ReiningKathryn Lawrence NP Height (inches): 66 Interpreting Physician: Nicki Guadalajarahomas Sueko Dimichele MD, ABSM Weight (lbs): 309 RPSGT: Rolene ArbourMcConnico, Yvonne BMI: 50 MRN: [id number] Neck Size: 18.00  CLINICAL INFORMATION Sleep Study Type: NPSG  Indication for sleep study: Excessive Daytime Sleepiness, Morning Headaches  Epworth Sleepiness Score: 3  SLEEP STUDY TECHNIQUE As per the AASM Manual for the Scoring of Sleep and Associated Events v2.3 (April 2016) with a hypopnea requiring 4% desaturations.  The channels recorded and monitored were frontal, central and occipital EEG, electrooculogram (EOG), submentalis EMG (chin), nasal and oral airflow, thoracic and abdominal wall motion, anterior tibialis EMG, snore microphone, electrocardiogram, and pulse oximetry.  MEDICATIONS     albuterol (PROVENTIL HFA;VENTOLIN HFA) 108 (90 Base) MCG/ACT inhaler         amoxicillin (AMOXIL) 500 MG capsule         losartan (COZAAR) 25 MG tablet         metoprolol tartrate (LOPRESSOR) 50 MG tablet      Medications self-administered by patient taken the night of the study : LOSARTAN, METOPROLOL  SLEEP ARCHITECTURE The study was initiated at 10:44:22 PM and ended at 4:53:55 AM.  Sleep onset time was 89.9 minutes and the sleep efficiency was 72.3%. The total sleep time was 267.2 minutes.  Stage REM latency was 129.5 minutes.  The patient spent 1.50% of the night in stage N1 sleep, 89.52% in stage N2 sleep, 0.37% in stage N3 and 8.61% in REM.  Alpha intrusion was absent.  Supine sleep was 38.93%.  RESPIRATORY PARAMETERS The overall apnea/hypopnea index (AHI) was 7.2 per hour.  The respiratory disturbance index (RDI) was 11.9 per hour. There were 13 total apneas, including 9 obstructive, 4 central and 0 mixed apneas. There were 19 hypopneas and 21 RERAs.  The AHI during Stage REM sleep was  26.1 per hour.  AHI while supine was 9.8 per hour.  The mean oxygen saturation was 96.30%. The minimum SpO2 during sleep was 90.00%.  Moderate snoring was noted during this study.  CARDIAC DATA The 2 lead EKG demonstrated sinus rhythm. The mean heart rate was 78.23 beats per minute. Other EKG findings include: None.  LEG MOVEMENT DATA The total PLMS were 5 with a resulting PLMS index of 1.12. Associated arousal with leg movement index was 0.0 .  IMPRESSIONS - Mild obstructive sleep apnea overall (AHI 7.2/h; RDI 11.9/h); however, sleep apnea was moderate doing REM sleep with an AHI of 26.1/h. - No significant central sleep apnea occurred during this study (CAI = 0.9/h). - No significant oxygen desaturation  - The patient snored with moderate snoring volume. - No cardiac abnormalities were noted during this study. - Clinically significant periodic limb movements did not occur during sleep. No significant associated arousals.  DIAGNOSIS - Obstructive Sleep Apnea (327.23 [G47.33 ICD-10])  RECOMMENDATIONS - In this patient with cardiac comorbidities, recommend CPAP titration study for optimal treatment of the patient's sleep apnea particularly with moderate sleep apnea during REM sleep.  if patient is against CPAP titration, consider customized oral dental appliance as an initial alternative to CPAP.  - Effort should be made to optimize nasal and oral pharyngeal patency. - Avoid alcohol, sedatives and other CNS depressants that may worsen sleep apnea and disrupt normal sleep architecture. - Sleep hygiene should be reviewed to assess factors that may improve sleep quality. - Weight management (BMI 50) and regular exercise should be initiated or continued  if appropriate.  [Electronically signed] 01/25/2018 01:36 PM  Nicki Guadalajara MD, Donneta Romberg Diplomate, American Board of Sleep Medicine   NPI: [id number] [facility] PH: [phone]   FX: [phone] ACCREDITED BY THE AMERICAN ACADEMY OF SLEEP MEDICINE

## 2018-01-26 NOTE — Progress Notes (Signed)
Reviewed, thank you. Patient being referred for CPAP

## 2018-01-26 NOTE — Progress Notes (Signed)
Thank you :)

## 2018-01-27 NOTE — Progress Notes (Signed)
LMTCB

## 2018-01-29 ENCOUNTER — Telehealth: Payer: Self-pay | Admitting: *Deleted

## 2018-01-29 NOTE — Telephone Encounter (Signed)
Attempted to call patient multiple times today to discuss sleep study results and recommendations. Line busy each time. Will attempt again later.

## 2018-01-30 ENCOUNTER — Encounter: Payer: Self-pay | Admitting: Cardiovascular Disease

## 2018-01-30 NOTE — Progress Notes (Signed)
After several unsuccessful attempts to reach patient, letter sent to have him to contact office for results.

## 2018-02-09 ENCOUNTER — Telehealth: Payer: Self-pay | Admitting: Cardiology

## 2018-02-09 ENCOUNTER — Other Ambulatory Visit: Payer: Self-pay | Admitting: Cardiovascular Disease

## 2018-02-09 DIAGNOSIS — G4733 Obstructive sleep apnea (adult) (pediatric): Secondary | ICD-10-CM

## 2018-02-09 DIAGNOSIS — I10 Essential (primary) hypertension: Secondary | ICD-10-CM

## 2018-02-09 MED ORDER — METOPROLOL TARTRATE 50 MG PO TABS: 50.0000 mg | ORAL_TABLET | Freq: Two times a day (BID) | ORAL | 0 refills | Status: DC

## 2018-02-09 MED ORDER — LOSARTAN POTASSIUM 25 MG PO TABS: 25.0000 mg | ORAL_TABLET | Freq: Two times a day (BID) | ORAL | 0 refills | Status: DC

## 2018-02-09 NOTE — Telephone Encounter (Signed)
New message     *STAT* If patient is at the pharmacy, call can be transferred to refill team.   1. Which medications need to be refilled? (please list name of each medication and dose if known) metoprolol tartrate (LOPRESSOR) 50 MG tablet and losartan (COZAAR) 25 MG tablet  2. Which pharmacy/location (including street and city if local pharmacy) is medication to be sent to?[facility] - [place], [place] - [id number]  3. Do they need a 30 day or 90 day supply? 90

## 2018-02-09 NOTE — Progress Notes (Signed)
Patient notified of results and recommendations. He voiced understanding and would like to precede with titration study.

## 2018-02-09 NOTE — Telephone Encounter (Signed)
New message    Patient returning call for sleep results. Please call

## 2018-02-10 MED ORDER — LOSARTAN POTASSIUM 25 MG PO TABS: 25.0000 mg | ORAL_TABLET | Freq: Two times a day (BID) | ORAL | 2 refills | Status: DC

## 2018-02-10 MED ORDER — METOPROLOL TARTRATE 50 MG PO TABS: 50.0000 mg | ORAL_TABLET | Freq: Two times a day (BID) | ORAL | 2 refills | Status: DC

## 2018-02-12 NOTE — Telephone Encounter (Signed)
Left message CPAP titration study scheduled for March 13th @ [facility]. Their contact information was left as well if he has question and/or concerns.

## 2018-02-25 ENCOUNTER — Ambulatory Visit (HOSPITAL_BASED_OUTPATIENT_CLINIC_OR_DEPARTMENT_OTHER): Payer: Medicaid Other | Attending: Cardiovascular Disease

## 2018-05-11 ENCOUNTER — Telehealth: Payer: Self-pay | Admitting: Cardiology

## 2018-05-11 MED ORDER — METOPROLOL TARTRATE 50 MG PO TABS: 50.0000 mg | ORAL_TABLET | Freq: Two times a day (BID) | ORAL | 2 refills | Status: AC

## 2018-05-11 NOTE — Telephone Encounter (Signed)
Pt called in requiring refill to [facility] in [place].   Kenneth Willis

## 2018-06-26 ENCOUNTER — Other Ambulatory Visit: Payer: Self-pay | Admitting: Cardiology

## 2018-06-26 MED ORDER — LOSARTAN POTASSIUM 25 MG PO TABS: 25.0000 mg | ORAL_TABLET | Freq: Two times a day (BID) | ORAL | 1 refills | Status: AC

## 2018-06-26 NOTE — Telephone Encounter (Signed)
Rx(s) sent to pharmacy electronically.  

## 2018-06-26 NOTE — Telephone Encounter (Signed)
Pt calling previous message concerning refill for Losartan,. Please advise

## 2018-06-26 NOTE — Telephone Encounter (Signed)
°*  STAT* If patient is at the pharmacy, call can be transferred to refill team.   1. Which medications need to be refilled? (please list name of each medication and dose if known) Losartan 25mg   2. Which pharmacy/location (including street and city if local pharmacy) is medication to be sent to?[facility] [address]    3. Do they need a 30 day or 90 day supply? 90 day

## 2019-05-03 IMAGING — CR DG CHEST 2V
2 series · 2 of 2 positions shown · non-contrast
Comparison: 08/06/2017

CLINICAL DATA: Palpitation

EXAM:
CHEST  2 VIEW

[w chest pa]
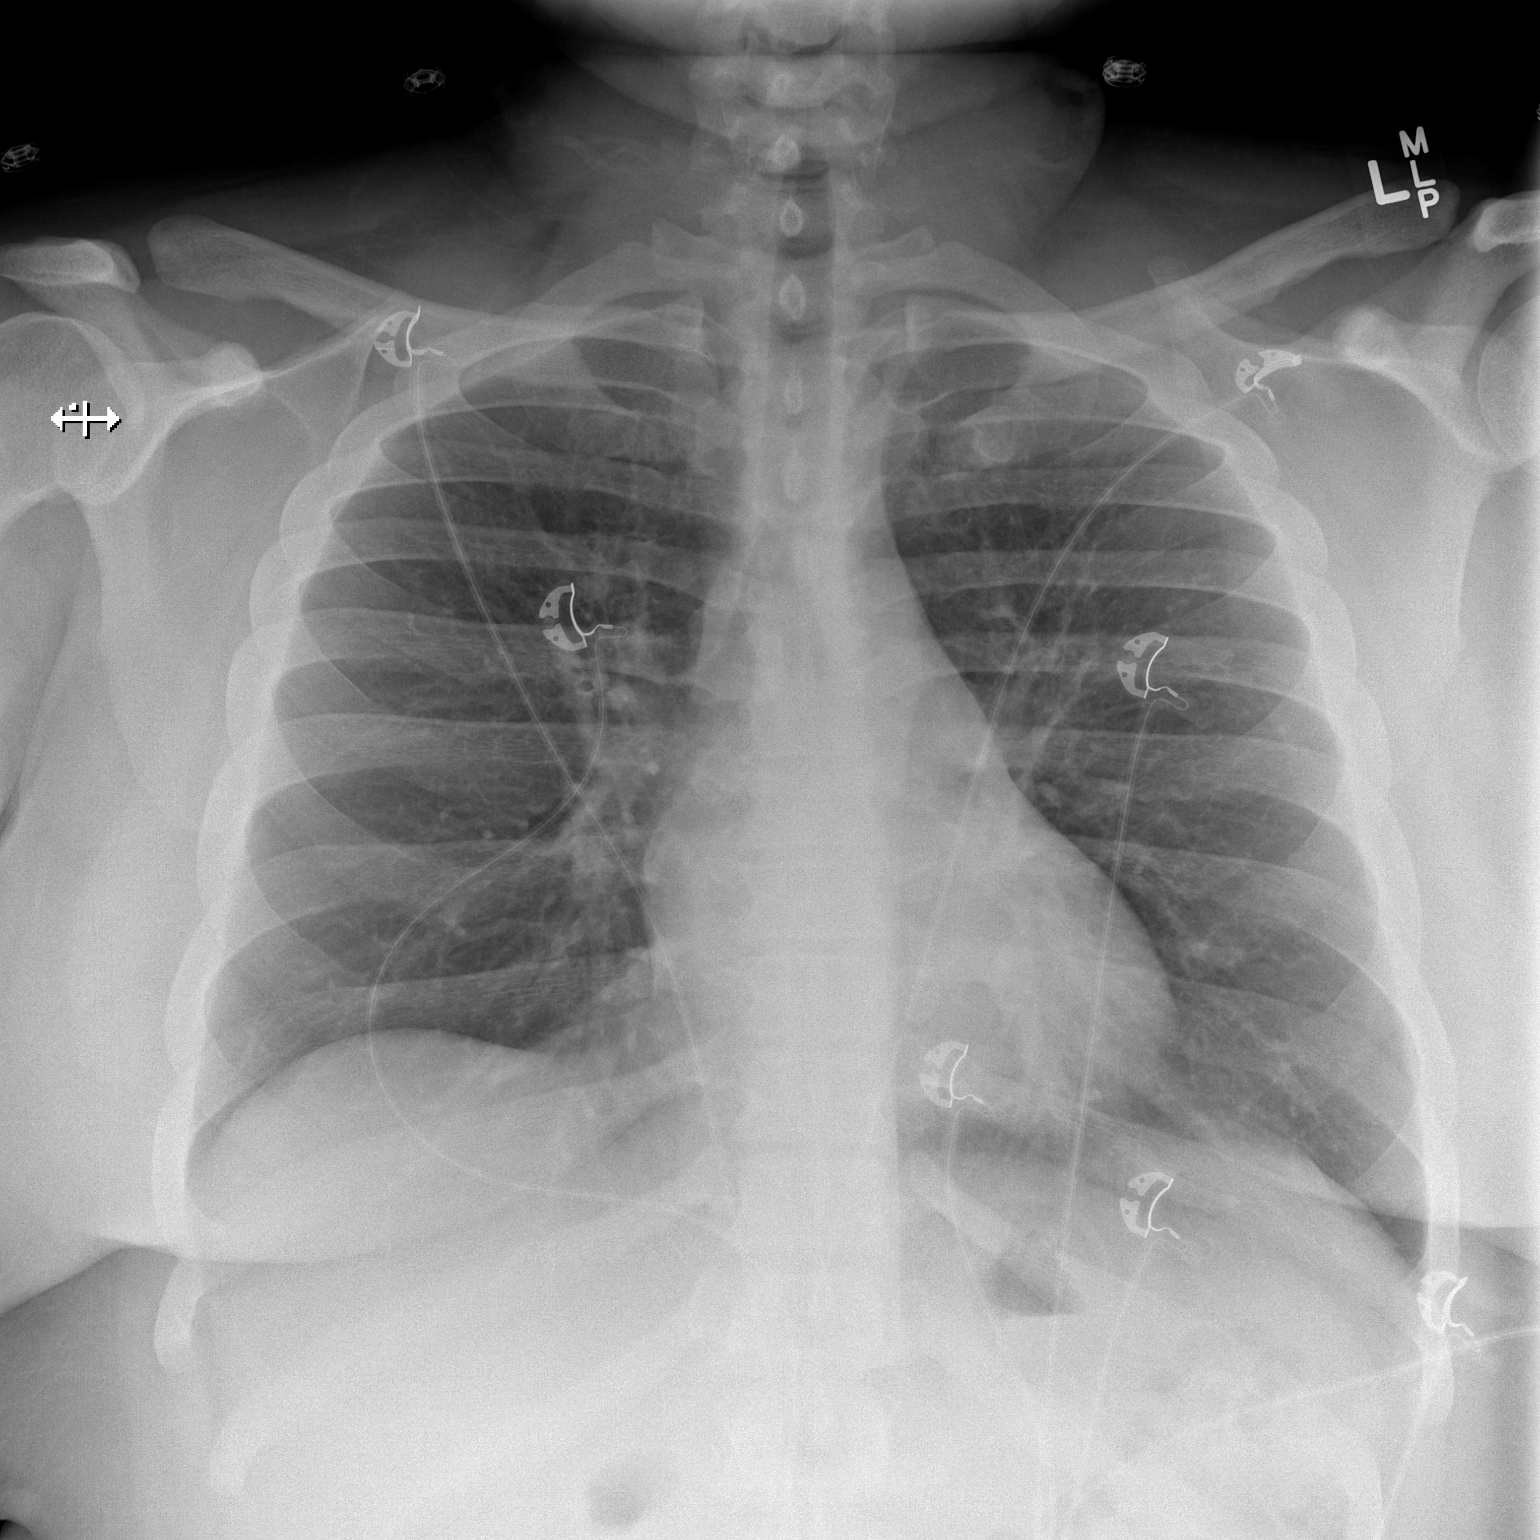

[w chest lat]
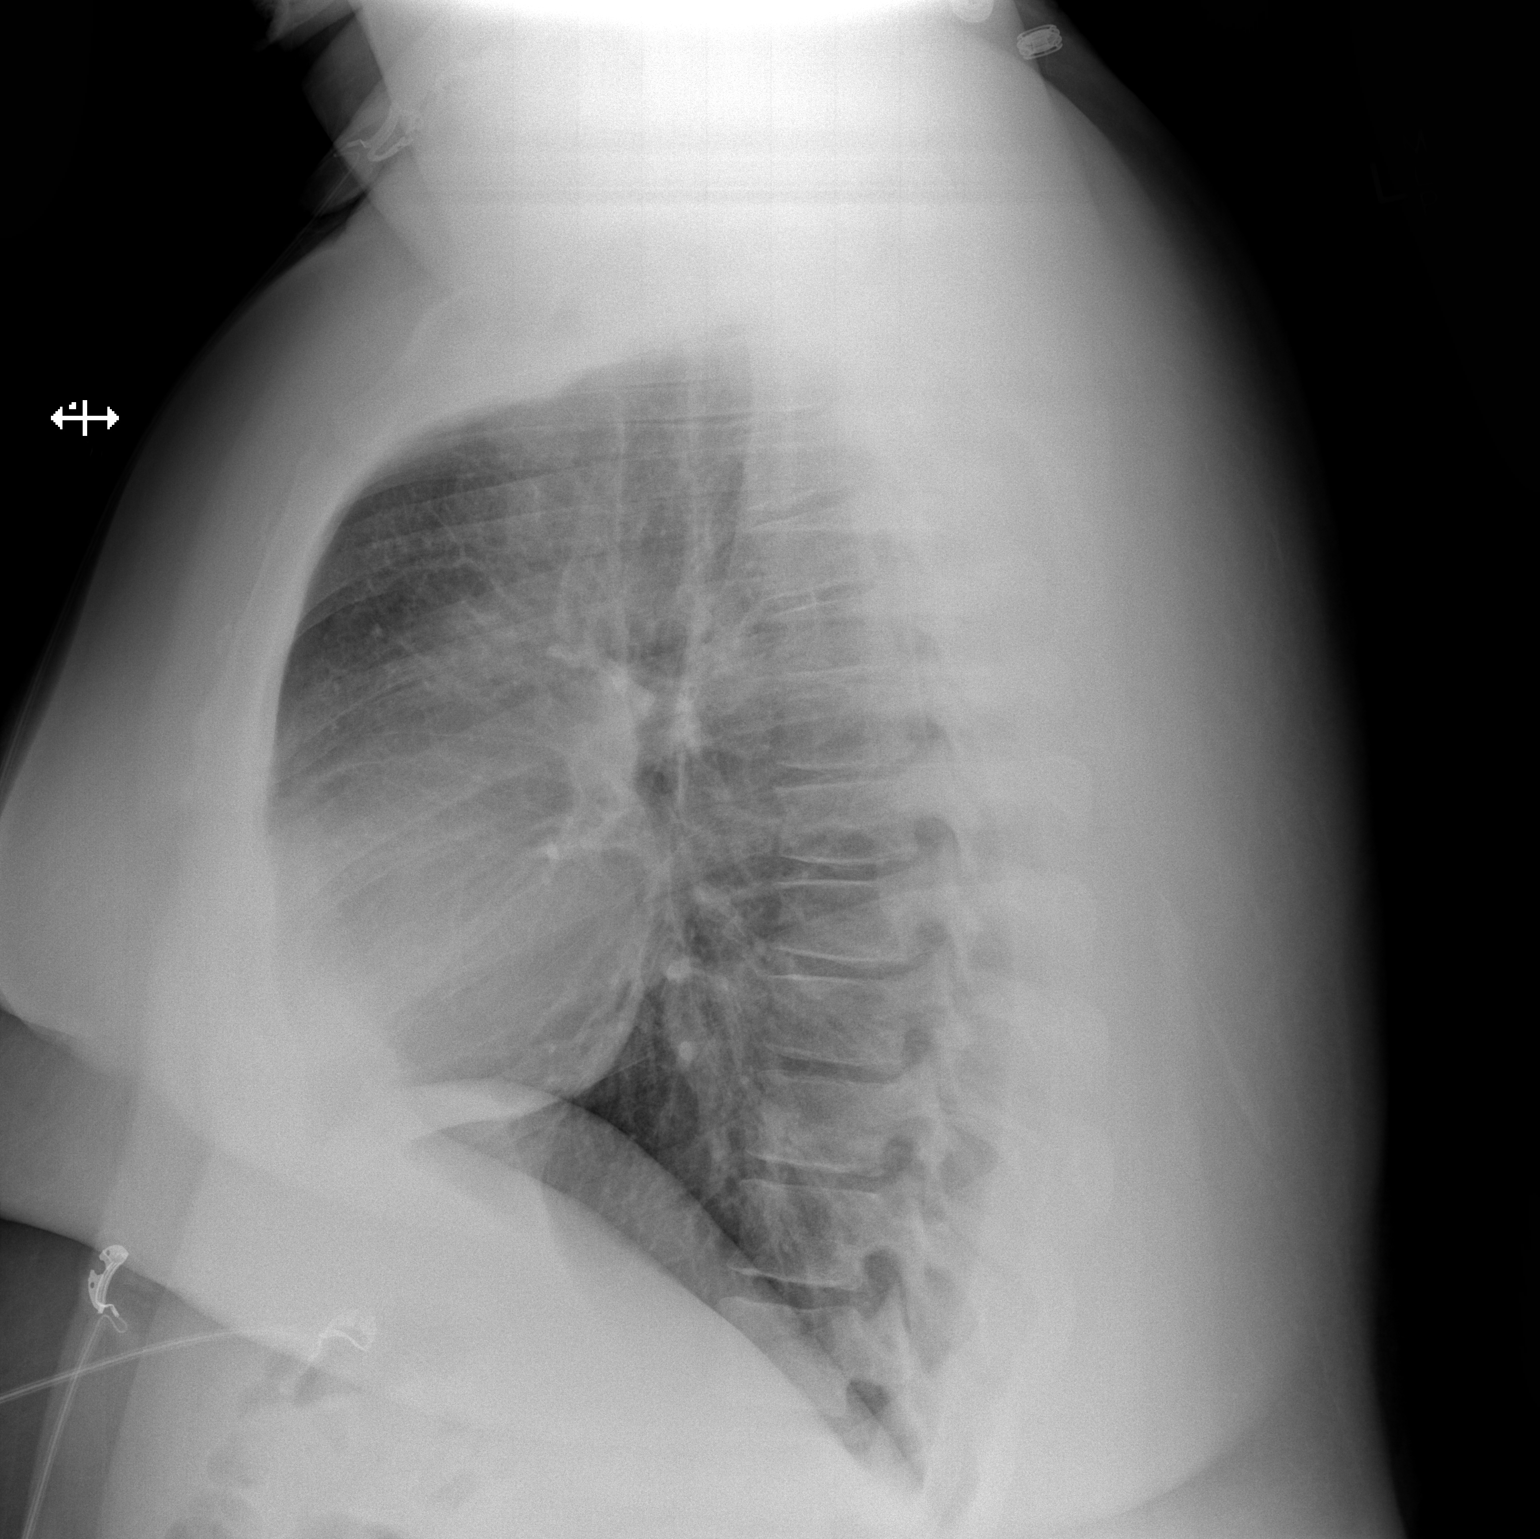

[2 of 2 positions shown; findings below may reference images not displayed]

FINDINGS: The heart size and mediastinal contours are within normal limits.
Both lungs are clear. The visualized skeletal structures are
unremarkable.
IMPRESSION: No active cardiopulmonary disease.
